# Patient Record
Sex: Female | Born: 1970 | Race: White | Hispanic: No | Marital: Married | Smoking: Never smoker
Health system: Southern US, Community
[De-identification: ages and names within clinical notes are randomized; demographics above are authoritative.]

## PROBLEM LIST (undated history)

## (undated) DIAGNOSIS — M1990 Unspecified osteoarthritis, unspecified site: Secondary | ICD-10-CM

## (undated) DIAGNOSIS — G43909 Migraine, unspecified, not intractable, without status migrainosus: Secondary | ICD-10-CM

## (undated) DIAGNOSIS — I10 Essential (primary) hypertension: Secondary | ICD-10-CM

## (undated) HISTORY — DX: Migraine, unspecified, not intractable, without status migrainosus: G43.909

---

## 2005-10-25 ENCOUNTER — Observation Stay: Payer: Self-pay

## 2005-11-01 ENCOUNTER — Ambulatory Visit: Payer: Self-pay | Admitting: Unknown Physician Specialty

## 2005-11-26 ENCOUNTER — Observation Stay: Payer: Self-pay | Admitting: Obstetrics & Gynecology

## 2005-11-29 ENCOUNTER — Inpatient Hospital Stay: Payer: Self-pay

## 2014-06-06 ENCOUNTER — Ambulatory Visit: Payer: Self-pay

## 2015-08-08 ENCOUNTER — Encounter: Payer: Self-pay | Admitting: Physician Assistant

## 2015-08-08 ENCOUNTER — Ambulatory Visit: Payer: Self-pay | Admitting: Physician Assistant

## 2015-08-08 VITALS — BP 140/99 | HR 80 | Temp 97.7°F

## 2015-08-08 DIAGNOSIS — E559 Vitamin D deficiency, unspecified: Secondary | ICD-10-CM

## 2015-08-08 DIAGNOSIS — M25569 Pain in unspecified knee: Secondary | ICD-10-CM

## 2015-08-08 DIAGNOSIS — Z0000 Encounter for general adult medical examination without abnormal findings: Secondary | ICD-10-CM

## 2015-08-08 DIAGNOSIS — G8929 Other chronic pain: Secondary | ICD-10-CM

## 2015-08-08 DIAGNOSIS — J018 Other acute sinusitis: Secondary | ICD-10-CM

## 2015-08-08 DIAGNOSIS — G4700 Insomnia, unspecified: Secondary | ICD-10-CM

## 2015-08-08 DIAGNOSIS — G43809 Other migraine, not intractable, without status migrainosus: Secondary | ICD-10-CM

## 2015-08-08 MED ORDER — PREDNISONE 10 MG PO TABS: 30.0000 mg | ORAL_TABLET | Freq: Every day | ORAL | Status: DC

## 2015-08-08 MED ORDER — NAPROXEN 500 MG PO TABS: 500.0000 mg | ORAL_TABLET | Freq: Two times a day (BID) | ORAL | Status: DC

## 2015-08-08 MED ORDER — SUMATRIPTAN SUCCINATE 50 MG PO TABS: 50.0000 mg | ORAL_TABLET | ORAL | Status: DC | PRN

## 2015-08-08 MED ORDER — AZITHROMYCIN 250 MG PO TABS: ORAL_TABLET | ORAL | Status: DC

## 2015-08-08 MED ORDER — ZOLPIDEM TARTRATE 10 MG PO TABS: 10.0000 mg | ORAL_TABLET | Freq: Every evening | ORAL | Status: DC | PRN

## 2015-08-08 MED ORDER — TRAMADOL HCL 50 MG PO TABS: 50.0000 mg | ORAL_TABLET | Freq: Three times a day (TID) | ORAL | Status: DC | PRN

## 2015-08-08 MED ORDER — RIZATRIPTAN BENZOATE 10 MG PO TABS: 10.0000 mg | ORAL_TABLET | ORAL | Status: DC | PRN

## 2015-08-08 MED ORDER — FLUTICASONE PROPIONATE 50 MCG/ACT NA SUSP: 2.0000 | Freq: Every day | NASAL | Status: DC

## 2015-08-08 NOTE — Progress Notes (Signed)
S: pt needs med refills, also has sinus pain and headache, pressure in ears, no fever/chills, no cp/sob, no congestion, some nausea from pnd, hx migraines, chronic knee pain , and insomnia  O: vitals w elevated bp, will recheck prior to discharge, tms dull and pink on left, nasal mucosa grossly red and swollen on left, throat wnl, neck supple no lymph, lungs c t a, cv rrr  A: elevated bp without hx of htn, acute sinusitis, migraine headache, chronic knee pain, insomnia  P: recheck bp, zpack, flonase, prednisone 30mg  qd x 3d, refill on imitrex 50mg , maxalt 10mg , naproxen 500mg , ambien 10mg  , tramadol

## 2015-08-09 LAB — CMP12+LP+TP+TSH+6AC+CBC/D/PLT
A/G RATIO: 1.9 (ref 1.1–2.5)
ALT: 16 IU/L (ref 0–32)
AST: 17 IU/L (ref 0–40)
Albumin: 4.3 g/dL (ref 3.5–5.5)
Alkaline Phosphatase: 90 IU/L (ref 39–117)
BASOS ABS: 0 10*3/uL (ref 0.0–0.2)
BILIRUBIN TOTAL: 0.5 mg/dL (ref 0.0–1.2)
BUN / CREAT RATIO: 13 (ref 9–23)
BUN: 9 mg/dL (ref 6–24)
Basos: 0 %
CHOLESTEROL TOTAL: 231 mg/dL — AB (ref 100–199)
Calcium: 8.9 mg/dL (ref 8.7–10.2)
Chloride: 100 mmol/L (ref 97–106)
Chol/HDL Ratio: 6.1 ratio units — ABNORMAL HIGH (ref 0.0–4.4)
Creatinine, Ser: 0.68 mg/dL (ref 0.57–1.00)
EOS (ABSOLUTE): 0.1 10*3/uL (ref 0.0–0.4)
EOS: 2 %
ESTIMATED CHD RISK: 1.7 times avg. — AB (ref 0.0–1.0)
FREE THYROXINE INDEX: 2.5 (ref 1.2–4.9)
GFR calc Af Amer: 123 mL/min/{1.73_m2} (ref >59)
GFR calc non Af Amer: 107 mL/min/{1.73_m2} (ref >59)
GGT: 38 IU/L (ref 0–60)
Globulin, Total: 2.3 g/dL (ref 1.5–4.5)
Glucose: 90 mg/dL (ref 65–99)
HDL: 38 mg/dL — AB (ref >39)
HEMOGLOBIN: 13.7 g/dL (ref 11.1–15.9)
Hematocrit: 40.9 % (ref 34.0–46.6)
IMMATURE GRANULOCYTES: 0 %
IRON: 119 ug/dL (ref 27–159)
Immature Grans (Abs): 0 10*3/uL (ref 0.0–0.1)
LDH: 180 IU/L (ref 119–226)
LDL Calculated: 172 mg/dL — ABNORMAL HIGH (ref 0–99)
LYMPHS ABS: 1.8 10*3/uL (ref 0.7–3.1)
Lymphs: 28 %
MCH: 29.4 pg (ref 26.6–33.0)
MCHC: 33.5 g/dL (ref 31.5–35.7)
MCV: 88 fL (ref 79–97)
Monocytes Absolute: 0.5 10*3/uL (ref 0.1–0.9)
Monocytes: 8 %
NEUTROS PCT: 62 %
Neutrophils Absolute: 3.9 10*3/uL (ref 1.4–7.0)
PLATELETS: 248 10*3/uL (ref 150–379)
Phosphorus: 2.8 mg/dL (ref 2.5–4.5)
Potassium: 4.8 mmol/L (ref 3.5–5.2)
RBC: 4.66 x10E6/uL (ref 3.77–5.28)
RDW: 12.7 % (ref 12.3–15.4)
Sodium: 137 mmol/L (ref 136–144)
T3 UPTAKE RATIO: 24 % (ref 24–39)
T4, Total: 10.4 ug/dL (ref 4.5–12.0)
TOTAL PROTEIN: 6.6 g/dL (ref 6.0–8.5)
TSH: 0.844 u[IU]/mL (ref 0.450–4.500)
Triglycerides: 106 mg/dL (ref 0–149)
Uric Acid: 4.5 mg/dL (ref 2.5–7.1)
VLDL CHOLESTEROL CAL: 21 mg/dL (ref 5–40)
WBC: 6.3 10*3/uL (ref 3.4–10.8)

## 2015-08-09 LAB — VITAMIN D 25 HYDROXY (VIT D DEFICIENCY, FRACTURES): Vit D, 25-Hydroxy: 15.1 ng/mL — ABNORMAL LOW (ref 30.0–100.0)

## 2015-08-12 MED ORDER — VITAMIN D (ERGOCALCIFEROL) 1.25 MG (50000 UNIT) PO CAPS: 50000.0000 [IU] | ORAL_CAPSULE | ORAL | Status: DC

## 2015-08-12 NOTE — Progress Notes (Signed)
Vit d was low, sent in rx for vit d to [facility], cma to call pt and notify of results of labs, recheck in 3 months for fasting lipids and vit d level

## 2015-08-12 NOTE — Addendum Note (Signed)
Addended by: Faythe GheeFISHER, Shakeia Krus W on: 08/12/2015 08:31 AM   Modules accepted: Orders

## 2015-09-26 ENCOUNTER — Encounter: Payer: Self-pay | Admitting: Physician Assistant

## 2015-09-26 ENCOUNTER — Ambulatory Visit: Payer: Self-pay | Admitting: Physician Assistant

## 2015-09-26 VITALS — BP 160/90 | HR 92 | Temp 98.2°F

## 2015-09-26 DIAGNOSIS — J0120 Acute ethmoidal sinusitis, unspecified: Secondary | ICD-10-CM

## 2015-09-26 MED ORDER — LEVOFLOXACIN 500 MG PO TABS
500.0000 mg | ORAL_TABLET | Freq: Every day | ORAL | Status: DC
Start: 2015-09-26 — End: 2015-12-09

## 2015-09-26 MED ORDER — METHYLPREDNISOLONE 4 MG PO TBPK: ORAL_TABLET | ORAL | Status: DC

## 2015-09-26 MED ORDER — PSEUDOEPH-BROMPHEN-DM 30-2-10 MG/5ML PO SYRP: 5.0000 mL | ORAL_SOLUTION | Freq: Four times a day (QID) | ORAL | Status: DC | PRN

## 2015-09-26 NOTE — Progress Notes (Signed)
S.  Patient c/o one week of sinus congestion, ear pressure, sore throat, non-productive cough and chest congestion.  Denies Fever/chill or  N/V/D. No palliative measures for this compliant. O.  No acute distress, HEENT for bilateral maxillary guarding, edematous bilateral nasal turbinates, thick post nasal drainage.  Neck supple, Lungs CTA, and Heart RRR> A. Maxillary sinusitis. P.  Levaquin, Bromfed DM, and Medrol dose pack.  F/U PRN.

## 2015-12-09 ENCOUNTER — Ambulatory Visit: Payer: Self-pay | Admitting: Registered Nurse

## 2015-12-09 VITALS — BP 180/99 | HR 103 | Temp 98.4°F

## 2015-12-09 DIAGNOSIS — J301 Allergic rhinitis due to pollen: Secondary | ICD-10-CM

## 2015-12-09 DIAGNOSIS — G8929 Other chronic pain: Secondary | ICD-10-CM

## 2015-12-09 DIAGNOSIS — H6593 Unspecified nonsuppurative otitis media, bilateral: Secondary | ICD-10-CM

## 2015-12-09 DIAGNOSIS — M25561 Pain in right knee: Secondary | ICD-10-CM

## 2015-12-09 DIAGNOSIS — J0121 Acute recurrent ethmoidal sinusitis: Secondary | ICD-10-CM

## 2015-12-09 DIAGNOSIS — R509 Fever, unspecified: Secondary | ICD-10-CM

## 2015-12-09 LAB — POCT INFLUENZA A/B
INFLUENZA A, POC: NEGATIVE
Influenza B, POC: NEGATIVE

## 2015-12-09 MED ORDER — CETIRIZINE HCL 10 MG PO TABS: 10.0000 mg | ORAL_TABLET | Freq: Every day | ORAL | Status: DC

## 2015-12-09 MED ORDER — DOXYCYCLINE HYCLATE 100 MG PO TABS: 100.0000 mg | ORAL_TABLET | Freq: Two times a day (BID) | ORAL | Status: DC

## 2015-12-09 MED ORDER — BENZONATATE 200 MG PO CAPS: 200.0000 mg | ORAL_CAPSULE | Freq: Three times a day (TID) | ORAL | Status: DC | PRN

## 2015-12-09 MED ORDER — FLUTICASONE PROPIONATE 50 MCG/ACT NA SUSP: 2.0000 | Freq: Every day | NASAL | Status: DC

## 2015-12-09 MED ORDER — TRAMADOL HCL 50 MG PO TABS: 50.0000 mg | ORAL_TABLET | Freq: Two times a day (BID) | ORAL | Status: AC | PRN

## 2015-12-09 MED ORDER — SALINE SPRAY 0.65 % NA SOLN: 1.0000 | NASAL | Status: DC | PRN

## 2015-12-09 NOTE — Progress Notes (Signed)
Subjective:    Patient ID: Alexandria [name], female    DOB: Feb 03, 1971, 45 y.o.   MRN: [id number]  HPI Comments: Caucasian female here for evaluation of right knee pain, chest congestion, cough, sinus pressure.  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  Within past three months had prednisone, Bromphed, levofloxacin for similar symptoms resolved.  Needs refill on tramadol  po prn also for chronic right knee pain not taking daily only if bad had ski injury not surgical candidate per patient.     Review of Systems  Constitutional: Negative for fever, chills, diaphoresis, activity change, appetite change, fatigue and unexpected weight change.  HENT: Positive for congestion, postnasal drip, rhinorrhea and sinus pressure. Negative for dental problem, drooling, ear discharge, ear pain, facial swelling, hearing loss, mouth sores, nosebleeds, sneezing, sore throat, tinnitus, trouble swallowing and voice change.   Eyes: Negative for photophobia, pain, discharge, redness, itching and visual disturbance.  Respiratory: Positive for cough. Negative for choking, chest tightness, shortness of breath, wheezing and stridor.   Cardiovascular: Negative for chest pain, palpitations and leg swelling.  Gastrointestinal: Negative for nausea, vomiting, abdominal pain, diarrhea, constipation, blood in stool and abdominal distention.  Endocrine: Negative for cold intolerance and heat intolerance.  Genitourinary: Negative for dysuria, hematuria and difficulty urinating.  Musculoskeletal: Positive for arthralgias. Negative for myalgias, back pain, joint swelling, gait problem, neck pain and neck stiffness.  Skin: Negative for color change, pallor, rash and wound.  Allergic/Immunologic: Positive for environmental allergies. Negative for food allergies.  Neurological: Negative for dizziness, tremors, seizures, syncope, facial asymmetry, speech difficulty, weakness, light-headedness, numbness and headaches.  Hematological:  Negative for adenopathy. Does not bruise/bleed easily.  Psychiatric/Behavioral: Negative for behavioral problems, confusion, sleep disturbance and agitation.       Objective:   Physical Exam  Constitutional: She is oriented to person, place, and time. She appears well-developed and well-nourished. She is active and cooperative.  Non-toxic appearance. She does not have a sickly appearance. She appears ill. No distress.  HENT:  Head: Normocephalic and atraumatic.  Right Ear: Hearing, external ear and ear canal normal. A middle ear effusion is present.  Left Ear: Hearing, external ear and ear canal normal. A middle ear effusion is present.  Nose: Mucosal edema and rhinorrhea present. No nose lacerations, sinus tenderness, nasal deformity, septal deviation or nasal septal hematoma. No epistaxis.  No foreign bodies. Right sinus exhibits no maxillary sinus tenderness and no frontal sinus tenderness. Left sinus exhibits no maxillary sinus tenderness and no frontal sinus tenderness.  Mouth/Throat: Uvula is midline and mucous membranes are normal. Mucous membranes are not pale, not dry and not cyanotic. She does not have dentures. No oral lesions. No trismus in the jaw. Normal dentition. No dental abscesses, uvula swelling, lacerations or dental caries. Posterior oropharyngeal edema and posterior oropharyngeal erythema present. No oropharyngeal exudate or tonsillar abscesses.  Cobblestoning posterior pharynx; bilateral nasal turbinates with edema/erythema/yellow discharge; bilateral TMs with air fluid level clear   Eyes: Conjunctivae, EOM and lids are normal. Pupils are equal, round, and reactive to light. Right eye exhibits no chemosis, no discharge, no exudate and no hordeolum. No foreign body present in the right eye. Left eye exhibits no chemosis, no discharge, no exudate and no hordeolum. No foreign body present in the left eye. Right conjunctiva is not injected. Right conjunctiva has no hemorrhage. Left  conjunctiva is not injected. Left conjunctiva has no hemorrhage. No scleral icterus. Right eye exhibits normal extraocular motion and no nystagmus. Left eye exhibits normal extraocular  motion and no nystagmus. Right pupil is round and reactive. Left pupil is round and reactive. Pupils are equal.  Neck: Trachea normal and normal range of motion. Neck supple. No tracheal tenderness, no spinous process tenderness and no muscular tenderness present. No rigidity. No tracheal deviation, no edema, no erythema and normal range of motion present. No thyroid mass and no thyromegaly present.  Cardiovascular: Normal rate, regular rhythm, S1 normal, S2 normal, normal heart sounds and intact distal pulses.  PMI is not displaced.  Exam reveals no gallop and no friction rub.   No murmur heard. Pulmonary/Chest: Effort normal and breath sounds normal. No accessory muscle usage or stridor. No respiratory distress. She has no decreased breath sounds. She has no wheezes. She has no rhonchi. She has no rales. She exhibits no tenderness.  Nonproductive cough intermittent in exam room with deep inspiration/talking  Abdominal: Soft. She exhibits no distension.  Musculoskeletal: Normal range of motion. She exhibits no edema or tenderness.       Right shoulder: Normal.       Left shoulder: Normal.       Right hip: Normal.       Left hip: Normal.       Right knee: Normal.       Left knee: Normal.       Cervical back: Normal.       Right hand: Normal.       Left hand: Normal.  Pain with flexion and weight bearing no limp noted or effusion/bruising/erythema/rash  Lymphadenopathy:       Head (right side): No submental, no submandibular, no tonsillar, no preauricular, no posterior auricular and no occipital adenopathy present.       Head (left side): No submental, no submandibular, no tonsillar, no preauricular, no posterior auricular and no occipital adenopathy present.    She has no cervical adenopathy.       Right  cervical: No superficial cervical, no deep cervical and no posterior cervical adenopathy present.      Left cervical: No superficial cervical, no deep cervical and no posterior cervical adenopathy present.  Neurological: She is alert and oriented to person, place, and time. She has normal strength. She is not disoriented. She displays no atrophy and no tremor. No cranial nerve deficit or sensory deficit. She exhibits normal muscle tone. She displays no seizure activity. Coordination and gait normal. GCS eye subscore is 4. GCS verbal subscore is 5. GCS motor subscore is 6.  Skin: Skin is warm, dry and intact. No abrasion, no bruising, no burn, no ecchymosis, no laceration, no lesion, no petechiae and no rash noted. She is not diaphoretic. No cyanosis or erythema. No pallor. Nails show no clubbing.  Psychiatric: She has a normal mood and affect. Her speech is normal and behavior is normal. Judgment and thought content normal. Cognition and memory are normal.  Nursing note and vitals reviewed.     Reviewed [place] Controlled Substances website with patient discussed weight loss/heat/ice/NSAID use did not recommend daily use of medication: 11/19/2015 08/08/2015 TRAMADOL HCL 50 MG TABLET [id number] [address] [place], [place] [id number] [address] [place], [place] Desir, DEB Mar 14, 1971 [address] [place], [place] [address] 01 20 11/19/2015 08/08/2015 ZOLPIDEM TARTRATE 10 MG TABLET [id number] [address] [place], [place] [id number] [address] [place], [place] Dai, DEB Sep 10, 1971 [address] [place], [place] [address] 04 0 10/14/2015 08/08/2015 TRAMADOL HCL 50 MG TABLET [id number]  20 5 2 3  [id number] [address] [place], [place] [id number] [address] [place], [place] Dumlao, DEB Jan 06, 1971 [address] [place], [place] [address] 04 20 10/14/2015 08/08/2015 ZOLPIDEM TARTRATE 10 MG  TABLET [id number] 30 30 2 3  [id number] [address] [place], [place] [id number] [address] [place], [place] Cuffe, DEB 1970-11-05 [address] [place], [place] [address] 04 0 09/11/2015 08/08/2015 TRAMADOL HCL 50 MG TABLET [id number] 20 5 1 3  [id number] [address] [place], [place] [id number] [address] [place], [place] Galyean, DEB [birth year] [address] [place], [place] [address] 04 20 09/11/2015 08/08/2015 ZOLPIDEM TARTRATE 10 MG TABLET [id number] 30 30 1 3  [id number] [address] [place], [place] [id number] [address] [place], [place] Nodal, DEB [birth year] [address] [place], [place] [address] 04 0 08/08/2015 08/08/2015 TRAMADOL HCL 50 MG TABLET [id number] 20 5 0 3 [id number] [address] [place], [place] [id number] [address] [place], [place] Allcock, DEB January 23, 1971 [address] [place], [place] [address] 04 20 08/08/2015 08/08/2015 ZOLPIDEM TARTRATE 10 MG TABLET [id number] 30 30 0 3 [id number] [address] [place], [place] [id number] [address] [place], [place], DEB [birth year] [address] [place], [place] [address] 04 0 01/01/2015 09/04/2014 ZOLPIDEM TARTRATE 10 MG TABLET [id number] 30 30 3 3  [id number] [address] [place], [place] [id number] [address] [place], [place] Soyars, DEB 05/31/1971 [address] [place], [place] [address] 04 0 12/05/2014 09/04/2014 ZOLPIDEM TARTRATE 10 MG TABLET [id number] 30 30 2 3  [id number] [address] [place], [place] [id number] [address] [place], [place] Orner, DEB [birth year] [address] [place], [place] [address] 04 0 11/19/2014 11/19/2014 TRAMADOL HCL 50 MG TABLET [id number] 20 5 0 0 [id number] [address] [place], [place] [id number] [address] [place], [place] Reetz, DEB August 23, 1971 [address] [place], [place] [address] 04 20  11/04/2014 09/04/2014 TRAMADOL HCL 50 MG TABLET [id number] 20 5 3 3  [id number] [address] [place], [place] [id number] [address] [place], [place], DEB [birth year] [address] [place], [place] [address]     Assessment & Plan:  A-acute ethmoidal sinusitis, allergic rhinitis seasonal, otitis media with effusion bilaterally, right knee pain chronic P-Supportive treatment.   No evidence of invasive bacterial infection, non toxic and well hydrated.  This is most likely self limiting viral infection.  I do not see where any further testing or imaging is necessary at this time.   I will suggest supportive care, rest, good hygiene and encourage the patient to take adequate fluids.  The patient is to return to clinic or EMERGENCY ROOM if symptoms worsen or change significantly e.g. ear pain, fever, purulent discharge from ears or bleeding.  Patient verbalized agreement and understanding of treatment plan.    Restart zyrtec 10mg  po daily refilled.  Patient may use normal saline nasal spray as needed.  Consider antihistamine or nasal steroid use.  Avoid triggers if possible.  Shower prior to bedtime if exposed to triggers.  If allergic dust/dust mites recommend mattress/pillow covers/encasements; washing linens, vacuuming, sweeping, dusting weekly.  Call or return to clinic as needed if these symptoms worsen or fail to improve as anticipated.    Patient verbalized understanding of instructions, agreed with plan of care and had no further questions at this time.  P2:  Avoidance and hand washing.  Restart flonase 1 spray each nostril BID, saline 2 sprays each nostril q2h prn congestion and zyrtec 10mg  po daily.  If no improvement with 48 hours of saline and flonase use start doxycycline 100mg  po BID x 10 days.  Rx given.  No evidence of systemic bacterial infection, non toxic and well hydrated.  I do not see where any further testing or imaging is necessary at this time.   I will  suggest supportive care, rest, good hygiene and encourage the patient to take adequate fluids.  The patient is to return to clinic or EMERGENCY ROOM if symptoms worsen or change significantly. Patient verbalized agreement and understanding of treatment plan and had no further questions at this time.   P2:  Hand washing and cover cough  Refilled patient tramadol 50mg  po BID prn #20 RF0 for severe pain only.  Avoid driving and alcohol intake for 8 hours after taking medication may cause sedation.  Discussed cryotherapy, stretching, weight loss and tylenol 1000mg  po QID prn pain first.  Patient was instructed to rest, ice and elevate leg.   Wear supportive shoes.   Medications as directed.  Call or return to clinic as needed if these symptoms worsen or fail to improve as anticipated.  Patient verbalized agreement and understanding of treatment plan.   P2:  ROM exercises, Stretching, weight loss

## 2016-01-28 ENCOUNTER — Other Ambulatory Visit: Payer: Self-pay | Admitting: Physician Assistant

## 2016-01-28 ENCOUNTER — Other Ambulatory Visit: Payer: Self-pay | Admitting: Family Medicine

## 2016-02-12 ENCOUNTER — Other Ambulatory Visit: Payer: Self-pay | Admitting: Physician Assistant

## 2016-02-13 NOTE — Telephone Encounter (Signed)
Med refill approved 

## 2016-03-10 ENCOUNTER — Encounter: Payer: Self-pay | Admitting: Physician Assistant

## 2016-03-10 ENCOUNTER — Ambulatory Visit: Payer: Self-pay

## 2016-03-10 VITALS — BP 150/80 | HR 99 | Temp 98.6°F

## 2016-03-10 DIAGNOSIS — N390 Urinary tract infection, site not specified: Secondary | ICD-10-CM

## 2016-03-10 DIAGNOSIS — R319 Hematuria, unspecified: Secondary | ICD-10-CM

## 2016-03-10 DIAGNOSIS — R509 Fever, unspecified: Secondary | ICD-10-CM

## 2016-03-10 DIAGNOSIS — G43809 Other migraine, not intractable, without status migrainosus: Secondary | ICD-10-CM

## 2016-03-10 LAB — POCT URINALYSIS DIPSTICK
GLUCOSE UA: NEGATIVE
KETONES UA: NEGATIVE
Nitrite, UA: NEGATIVE
SPEC GRAV UA: 1.02
Urobilinogen, UA: 1
pH, UA: 5.5

## 2016-03-10 MED ORDER — CIPROFLOXACIN HCL 250 MG PO TABS: 250.0000 mg | ORAL_TABLET | Freq: Two times a day (BID) | ORAL | Status: DC

## 2016-03-10 MED ORDER — TRAMADOL HCL 50 MG PO TABS: 50.0000 mg | ORAL_TABLET | Freq: Three times a day (TID) | ORAL | Status: DC | PRN

## 2016-03-10 MED ORDER — NAPROXEN 500 MG PO TABS: 500.0000 mg | ORAL_TABLET | Freq: Two times a day (BID) | ORAL | Status: DC

## 2016-03-10 NOTE — Progress Notes (Signed)
S: C/o runny nose and congestion with dry cough for 2 days, + fever, chills, temp at 102.9 last night; denies cp/sob, v/d; states is urinating a lot, denies tick bite; also needs refill on tramadol for knee pain, only uses at night, naprosyn for migraines;   O: PE: vitals wnl, nad,  perrl eomi, normocephalic, tms dull, nasal mucosa red and swollen, throat injected, neck supple no lymph, lungs c t a, cv rrr, neuro intact, ua 1+ leuks, 1+ blood, 1+ bili  A:  Acute uti   P: drink fluids, continue regular meds , use otc meds of choice, return if not improving in 5 days, return earlier if worsening , tramadol 50mg  #30 nr, naprosyn, cipro 250mg  bid x 7d

## 2016-04-14 ENCOUNTER — Other Ambulatory Visit: Payer: Self-pay | Admitting: Physician Assistant

## 2016-04-15 NOTE — Telephone Encounter (Signed)
Med refill approved 

## 2016-05-25 ENCOUNTER — Encounter (INDEPENDENT_AMBULATORY_CARE_PROVIDER_SITE_OTHER): Payer: Self-pay

## 2016-05-25 ENCOUNTER — Encounter: Payer: Self-pay | Admitting: Family Medicine

## 2016-05-25 ENCOUNTER — Ambulatory Visit (INDEPENDENT_AMBULATORY_CARE_PROVIDER_SITE_OTHER): Payer: 59 | Admitting: Family Medicine

## 2016-05-25 VITALS — BP 154/92 | HR 86 | Temp 98.1°F | Ht 63.5 in | Wt 194.0 lb

## 2016-05-25 DIAGNOSIS — G43909 Migraine, unspecified, not intractable, without status migrainosus: Secondary | ICD-10-CM | POA: Insufficient documentation

## 2016-05-25 DIAGNOSIS — M1711 Unilateral primary osteoarthritis, right knee: Secondary | ICD-10-CM | POA: Insufficient documentation

## 2016-05-25 DIAGNOSIS — Z3041 Encounter for surveillance of contraceptive pills: Secondary | ICD-10-CM | POA: Diagnosis not present

## 2016-05-25 DIAGNOSIS — I10 Essential (primary) hypertension: Secondary | ICD-10-CM

## 2016-05-25 DIAGNOSIS — M1731 Unilateral post-traumatic osteoarthritis, right knee: Secondary | ICD-10-CM

## 2016-05-25 DIAGNOSIS — J309 Allergic rhinitis, unspecified: Secondary | ICD-10-CM | POA: Diagnosis not present

## 2016-05-25 DIAGNOSIS — G43109 Migraine with aura, not intractable, without status migrainosus: Secondary | ICD-10-CM | POA: Diagnosis not present

## 2016-05-25 MED ORDER — LEVONORGEST-ETH ESTRAD 91-DAY 0.15-0.03 &0.01 MG PO TABS: 1.0000 | ORAL_TABLET | Freq: Every day | ORAL | 4 refills | Status: DC

## 2016-05-25 MED ORDER — FLUTICASONE PROPIONATE 50 MCG/ACT NA SUSP: 2.0000 | Freq: Every day | NASAL | 6 refills | Status: DC

## 2016-05-25 MED ORDER — FEXOFENADINE HCL 180 MG PO TABS: 180.0000 mg | ORAL_TABLET | Freq: Every day | ORAL | Status: DC

## 2016-05-25 MED ORDER — AMLODIPINE BESYLATE 2.5 MG PO TABS: 2.5000 mg | ORAL_TABLET | Freq: Every day | ORAL | 3 refills | Status: DC

## 2016-05-25 MED ORDER — FEXOFENADINE HCL 180 MG PO TABS: 180.0000 mg | ORAL_TABLET | Freq: Every day | ORAL | 1 refills | Status: DC

## 2016-05-25 NOTE — Assessment & Plan Note (Addendum)
Notes history of migraines with occasional aura. No current migraine. She will continue current medications as needed for this. Return precautions in AVS.

## 2016-05-25 NOTE — Assessment & Plan Note (Signed)
Patient currently on a combined OCP. Discussed risk of this medication in somebody with migraines with aura. She notes she wants to continue this medication and accepts the risk. I did advise that there are other better alternatives though she declined these at this time.

## 2016-05-25 NOTE — Progress Notes (Signed)
Pre visit review using our clinic review tool, if applicable. No additional management support is needed unless otherwise documented below in the visit note. 

## 2016-05-25 NOTE — Assessment & Plan Note (Signed)
Upper respiratory symptoms most consistent with allergic rhinitis. We will treat with Flonase and Allegra.

## 2016-05-25 NOTE — Progress Notes (Signed)
Alexandria [name] Tobby [name], Alexandria [name] Phone: [phone]  Alexandria SierrasDebbie J [name] is a 45 y.o. female who presents today for new patient visit.  Elevated blood pressure: No history of elevated blood pressure. No chest pain, shortness of breath, edema, or palpitations. No history of medications for this. Parents have hypertension.  Migraines: Patient has a history of migraines. Typically gets them about once a month. This is improved over the last several years. Started when she was [age]. Notes she has to go in a dark room and lay down. Positive photophobia and phonophobia. Takes sumatriptan or rizatriptan. Has only had to repeat dosing of one of these once. Notes occasionally has aura with these.  Allergic rhinitis: Notes some sinus pressure and congestion that started yesterday. Ears have mild pressure. Nothing out of her nose. Does have a history of allergies. Some postnasal drip. Zyrtec hasn't been terribly helpful.  Right knee osteoarthritis: Patient notes followed by orthopedics for this. Tramadol and Aleve are beneficial. Notes she hurt it after snow skiing 4 years ago and has had some discomfort since then.  Oral contraceptive therapy: Patient needs a refill on her birth control. No history of blood clots. Does not smoke. On her current form of birth control she has a period every 3 months. Last for about 4 days.  Active Ambulatory Problems    Diagnosis Date Noted  . Essential hypertension 05/25/2016  . Migraine 05/25/2016  . Allergic rhinitis 05/25/2016  . Oral contraceptive use 05/25/2016  . Osteoarthritis of right knee 05/25/2016   Resolved Ambulatory Problems    Diagnosis Date Noted  . No Resolved Ambulatory Problems   Past Medical History:  Diagnosis Date  . Chicken pox   . Migraines     Family History  Problem Relation Age of Onset  . Hypertension Mother   . Hypertension Father     Social History   Social History  . Marital status: Married    Spouse name: N/A  . Number of children:  N/A  . Years of education: N/A   Occupational History  . Not on file.   Social History Main Topics  . Smoking status: Never Smoker  . Smokeless tobacco: Never Used  . Alcohol use 0.0 - 0.6 oz/week  . Drug use: No  . Sexual activity: Not on file   Other Topics Concern  . Not on file   Social History Narrative  . No narrative on file    ROS  General:  Negative for nexplained weight loss, fever Skin: Negative for new or changing mole, sore that won't heal HEENT: Negative for trouble hearing, trouble seeing, ringing in ears, mouth sores, hoarseness, change in voice, dysphagia. CV:  Negative for chest pain, dyspnea, edema, palpitations Resp: Negative for cough, dyspnea, hemoptysis GI: Negative for nausea, vomiting, diarrhea, constipation, abdominal pain, melena, hematochezia. GU: Negative for dysuria, incontinence, urinary hesitance, hematuria, vaginal or penile discharge, polyuria, sexual difficulty, lumps in testicle or breasts MSK: Negative for muscle cramps or aches, joint pain or swelling Neuro: Positive for headaches, negative for weakness, numbness, dizziness, passing out/fainting Psych: Negative for depression, anxiety, memory problems  Objective  Physical Exam Vitals:   05/25/16 1102  BP: (!) 154/92  Pulse: 86  Temp: 98.1 F (36.7 C)    BP Readings from Last 3 Encounters:  05/25/16 (!) 154/92  03/10/16 (!) 150/80  12/09/15 (!) 180/99   Wt Readings from Last 3 Encounters:  05/25/16 194 lb (88 kg)    Physical Exam  Constitutional: No distress.  HENT:  Head: Normocephalic and atraumatic.  Right Ear: External ear normal.  Left Ear: External ear normal.  Mouth/Throat: No oropharyngeal exudate.  Eyes: Conjunctivae are normal. Pupils are equal, round, and reactive to light.  Cardiovascular: Normal rate, regular rhythm and normal heart sounds.   Pulmonary/Chest: Breath sounds normal.  Abdominal: Soft. Bowel sounds are normal. She exhibits no distension.  There is no tenderness. There is no rebound and no guarding.  Musculoskeletal: She exhibits no edema.  Bilateral knees with no tenderness, swelling, warmth, or erythema, negative McMurray's, no ligamentous laxity  Neurological: She is alert. Gait normal.  Moves all extremities equally  Skin: Skin is warm and dry. She is not diaphoretic.  Psychiatric: Affect normal.     Assessment/Plan:   Essential hypertension Elevated today. Has been elevated recently. Does have a family history. We'll start on low dose amlodipine. We'll check lab work as well. She is given a handwritten prescription [facility] to have her lab work done. She'll follow-up in one week for nurse visit and she will check her blood pressure daily until she follows up.  Migraine Notes history of migraines with occasional aura. No current migraine. She will continue current medications as needed for this. Return precautions in AVS.  Allergic rhinitis Upper respiratory symptoms most consistent with allergic rhinitis. We will treat with Flonase and Allegra.  Oral contraceptive use Patient currently on a combined OCP. Discussed risk of this medication in somebody with migraines with aura. She notes she wants to continue this medication and accepts the risk. I did advise that there are other better alternatives though she declined these at this time.  Osteoarthritis of right knee Followed by orthopedics. Continue naproxen and tramadol.   No orders of the defined types were placed in this encounter.    Alexandria [name] Alexandria [name], Alexandria [name] [facility]

## 2016-05-25 NOTE — Patient Instructions (Addendum)
Nice to meet you. You on Flonase and Allegra for your allergies. Please continue to monitor your headaches. If they worsen or change please let us know. We'll start you on amlodipine for your blood pressure. Please check your blood pressure at home for the next several weeks and give us a call with the results. We will refill your birth control. Given her history of migraines there is risk of stroke with this. Please be aware of this. If you develop persistent headaches, numbness, weakness, or any new or change in symptoms please seek medical attention.

## 2016-05-25 NOTE — Assessment & Plan Note (Signed)
Elevated today. Has been elevated recently. Does have a family history. We'll start on low dose amlodipine. We'll check lab work as well. She is given a handwritten prescription go to the employee health clinic to have her lab work done. She'll follow-up in one week for nurse visit and she will check her blood pressure daily until she follows up.

## 2016-05-25 NOTE — Assessment & Plan Note (Signed)
Followed by orthopedics. Continue naproxen and tramadol.

## 2016-06-02 ENCOUNTER — Ambulatory Visit (INDEPENDENT_AMBULATORY_CARE_PROVIDER_SITE_OTHER): Payer: 59

## 2016-06-02 VITALS — BP 140/82 | HR 86 | Resp 18

## 2016-06-02 DIAGNOSIS — I10 Essential (primary) hypertension: Secondary | ICD-10-CM | POA: Diagnosis not present

## 2016-06-02 NOTE — Progress Notes (Signed)
Care was provided under my supervision. BP stable. Continue current medication.  Everlene OtherJayce Kainon Varady DO

## 2016-06-02 NOTE — Progress Notes (Signed)
Patient came in for BP check.  Started amlodopine last week. Taking 2.5mg  daily, no issues.  PCP requested a BP check in 8/15 OV.   Checked BP in both upper extremities.  See vitals for details.    [facility]   Please advise in PCP's absence, thanks

## 2016-06-28 ENCOUNTER — Ambulatory Visit: Payer: Self-pay | Admitting: Physician Assistant

## 2016-06-28 ENCOUNTER — Encounter: Payer: Self-pay | Admitting: Physician Assistant

## 2016-06-28 VITALS — BP 150/90 | HR 87 | Temp 98.5°F

## 2016-06-28 DIAGNOSIS — I10 Essential (primary) hypertension: Secondary | ICD-10-CM

## 2016-06-28 DIAGNOSIS — B349 Viral infection, unspecified: Secondary | ICD-10-CM

## 2016-06-28 MED ORDER — CETIRIZINE HCL 10 MG PO TABS: 10.0000 mg | ORAL_TABLET | Freq: Every day | ORAL | 11 refills | Status: DC

## 2016-06-28 MED ORDER — AMLODIPINE BESYLATE 5 MG PO TABS: 5.0000 mg | ORAL_TABLET | Freq: Every day | ORAL | 3 refills | Status: DC

## 2016-06-28 MED ORDER — PREDNISONE 10 MG PO TABS: 30.0000 mg | ORAL_TABLET | Freq: Every day | ORAL | 0 refills | Status: DC

## 2016-06-28 NOTE — Progress Notes (Addendum)
S: C/o sinus pressure congestion for 3 days, no fever, chills, cp/sob, v/d; feels "swimmy headed" cough is sporadic, c/o of facial and dental pain. Some nausea ?drainage  Using otc meds:   O: PE: vitals wnl except bp elevated at 150/98, nad,  perrl eomi, normocephalic, lower lip with fever blister,  tms dull, nasal mucosa pin, throat injected, neck supple no lymph, lungs c t a, cv rrr, neuro intact  A:  Acute viral illness , htn  P: drink fluids, continue regular meds , use otc meds of choice, return if not improving in 5 days, return earlier if worsening , prednisone 30mg  qd x 3d, zyrtec instead of allegra as pt states its not helping, increase norvasc to 5mg  qd  Pt called office still having sinus pressure and dizziness , ?if we can call in medication, [facility]

## 2016-07-01 MED ORDER — AZITHROMYCIN 250 MG PO TABS: ORAL_TABLET | ORAL | 0 refills | Status: DC

## 2016-07-01 MED ORDER — MECLIZINE HCL 25 MG PO TABS: 25.0000 mg | ORAL_TABLET | Freq: Three times a day (TID) | ORAL | 0 refills | Status: DC | PRN

## 2016-07-01 NOTE — Addendum Note (Signed)
Addended by: Faythe GheeFISHER, Nakiah Osgood W on: 07/01/2016 09:19 AM   Modules accepted: Orders

## 2016-08-24 ENCOUNTER — Other Ambulatory Visit: Payer: Self-pay | Admitting: Physician Assistant

## 2016-09-16 ENCOUNTER — Ambulatory Visit: Payer: Self-pay | Admitting: Physician Assistant

## 2016-09-16 VITALS — BP 159/90 | HR 93 | Temp 98.0°F

## 2016-09-16 DIAGNOSIS — N390 Urinary tract infection, site not specified: Secondary | ICD-10-CM

## 2016-09-16 DIAGNOSIS — Z299 Encounter for prophylactic measures, unspecified: Secondary | ICD-10-CM

## 2016-09-16 LAB — POCT URINALYSIS DIPSTICK
Bilirubin, UA: NEGATIVE
Glucose, UA: NEGATIVE
Ketones, UA: NEGATIVE
NITRITE UA: NEGATIVE
PROTEIN UA: NEGATIVE
Spec Grav, UA: 1.02
UROBILINOGEN UA: 1
pH, UA: 6.5

## 2016-09-16 MED ORDER — FLUCONAZOLE 150 MG PO TABS: ORAL_TABLET | ORAL | 0 refills | Status: DC

## 2016-09-16 MED ORDER — CIPROFLOXACIN HCL 250 MG PO TABS: 250.0000 mg | ORAL_TABLET | Freq: Two times a day (BID) | ORAL | 0 refills | Status: DC

## 2016-09-16 MED ORDER — TRAMADOL HCL 50 MG PO TABS: 50.0000 mg | ORAL_TABLET | Freq: Three times a day (TID) | ORAL | 3 refills | Status: DC | PRN

## 2016-09-16 MED ORDER — MELOXICAM 15 MG PO TABS: 15.0000 mg | ORAL_TABLET | Freq: Every day | ORAL | 5 refills | Status: DC

## 2016-09-16 NOTE — Addendum Note (Signed)
Addended by: Faythe GheeFISHER, Emaan Gary W on: 09/16/2016 04:09 PM   Modules accepted: Orders

## 2016-09-16 NOTE — Progress Notes (Addendum)
S:  C/o uti sx for 2 days, burning, urgency, frequency, +vaginal discharge, states it looks like yeast, no odor; no abdominal pain or flank pain, no fever/chills:  Remainder ros neg  O:  Vitals wnl, nad, no cva tenderness, back nontender, lungs c t a,cv rrr, abd soft nontender, bs normal, n/v intact; ua 3+ leuks  A: uti  P: cipro 250mg  bid x 7d, increase water intake, add cranberry juice, return if not improving in 2 -3 days, return earlier if worsening, discussed pyelonephritis sx, med refill on tramadol 50mg  30 with 3 refills  Urine culture shows resistant to cipro, [facility]

## 2016-09-20 LAB — URINE CULTURE

## 2016-09-20 MED ORDER — NITROFURANTOIN MONOHYD MACRO 100 MG PO CAPS: 100.0000 mg | ORAL_CAPSULE | Freq: Two times a day (BID) | ORAL | 0 refills | Status: DC

## 2016-09-20 NOTE — Addendum Note (Signed)
Addended by: Faythe GheeFISHER, SUSAN W on: 09/20/2016 08:17 AM   Modules accepted: Orders

## 2016-09-24 ENCOUNTER — Other Ambulatory Visit: Payer: Self-pay | Admitting: Physician Assistant

## 2016-09-24 DIAGNOSIS — E559 Vitamin D deficiency, unspecified: Secondary | ICD-10-CM

## 2016-09-24 NOTE — Telephone Encounter (Signed)
Med refill for vit d approved 

## 2016-11-16 ENCOUNTER — Ambulatory Visit: Payer: Self-pay | Admitting: Physician Assistant

## 2016-11-16 ENCOUNTER — Encounter: Payer: Self-pay | Admitting: Physician Assistant

## 2016-11-16 VITALS — BP 130/70 | HR 89 | Temp 97.9°F

## 2016-11-16 DIAGNOSIS — J069 Acute upper respiratory infection, unspecified: Secondary | ICD-10-CM

## 2016-11-16 DIAGNOSIS — H60543 Acute eczematoid otitis externa, bilateral: Secondary | ICD-10-CM

## 2016-11-16 MED ORDER — FLUCONAZOLE 150 MG PO TABS
ORAL_TABLET | ORAL | 0 refills | Status: DC
Start: 2016-11-16 — End: 2017-03-09

## 2016-11-16 MED ORDER — MOMETASONE FUROATE 0.1 % EX SOLN: Freq: Every day | CUTANEOUS | 6 refills | Status: DC

## 2016-11-16 MED ORDER — AZITHROMYCIN 250 MG PO TABS: ORAL_TABLET | ORAL | 0 refills | Status: DC

## 2016-11-16 NOTE — Progress Notes (Signed)
S: C/o sore throat and congestion for 3 days, no fever, chills, cp/sob, v/d; mucus is green and thick, cough is sporadic, c/o of facial and dental pain. No body aches, also eczema in ears is getting irritated again, ?if we could rx a med to help  Using otc meds:   O: PE: vitals wnl, nad, perrl eomi, normocephalic, ear canals red, a little scaly, tms dull, nasal mucosa red and swollen, throat injected, neck supple no lymph, lungs c t a, cv rrr, neuro intact  A:  Acute sinusitis   P: drink fluids, continue regular meds , use otc meds of choice, return if not improving in 5 days, return earlier if worsening , zpack, diflucan if needed, mometasone .01% lotion

## 2016-11-29 ENCOUNTER — Other Ambulatory Visit: Payer: Self-pay | Admitting: Physician Assistant

## 2016-11-29 DIAGNOSIS — G43809 Other migraine, not intractable, without status migrainosus: Secondary | ICD-10-CM

## 2016-11-29 NOTE — Telephone Encounter (Signed)
Med refill for naproxen approved 

## 2017-03-08 ENCOUNTER — Ambulatory Visit: Payer: Self-pay | Admitting: Physician Assistant

## 2017-03-09 ENCOUNTER — Encounter: Payer: Self-pay | Admitting: Physician Assistant

## 2017-03-09 ENCOUNTER — Ambulatory Visit
Admission: RE | Admit: 2017-03-09 | Discharge: 2017-03-09 | Disposition: A | Discharge status: Home / Self Care | Payer: Managed Care, Other (non HMO) | Source: Ambulatory Visit | Attending: Physician Assistant | Admitting: Physician Assistant

## 2017-03-09 ENCOUNTER — Ambulatory Visit: Payer: Self-pay | Admitting: Physician Assistant

## 2017-03-09 VITALS — BP 140/80 | HR 100 | Temp 98.5°F

## 2017-03-09 DIAGNOSIS — M7989 Other specified soft tissue disorders: Secondary | ICD-10-CM | POA: Insufficient documentation

## 2017-03-09 DIAGNOSIS — M79605 Pain in left leg: Secondary | ICD-10-CM

## 2017-03-09 MED ORDER — FLUOCINOLONE ACETONIDE 0.01 % OT OIL: 2.0000 [drp] | TOPICAL_OIL | Freq: Two times a day (BID) | OTIC | 6 refills | Status: AC

## 2017-03-09 MED ORDER — TRAMADOL HCL 50 MG PO TABS: 50.0000 mg | ORAL_TABLET | Freq: Three times a day (TID) | ORAL | 3 refills | Status: DC | PRN

## 2017-03-09 MED ORDER — DICLOFENAC SODIUM 75 MG PO TBEC: 75.0000 mg | DELAYED_RELEASE_TABLET | Freq: Two times a day (BID) | ORAL | 0 refills | Status: DC

## 2017-03-09 NOTE — Progress Notes (Signed)
S: c/o left leg pain and swelling, pain in left knee and left ankle, lower leg is a little warm, took otc ibuprofen without any relief, no known injury, no fever/chills, fam hx of gout and RA, is on bcp, nonsmoker but is sedentary  O: vitals wnl, nad, skin on lower leg has increased warmth at the anterior and posterior near ankle, ankle is tender in soft tissue area, left knee has small amount swelling, full rom, no bony tenderness, n/v intact  A: left leg pain  P: us of lower leg due to sedentary and bcp use, diclofenac 75mg  bid, refill on dermotic, refill on tramadol 50mg  #20 2 refills, labs for uric acid and RA

## 2017-03-10 LAB — SPECIMEN STATUS

## 2017-03-10 LAB — RHEUMATOID FACTOR: Rhuematoid fact SerPl-aCnc: <10 IU/mL (ref 0.0–13.9)

## 2017-03-10 LAB — URIC ACID: Uric Acid: 6.1 mg/dL (ref 2.5–7.1)

## 2017-03-15 ENCOUNTER — Telehealth: Payer: Self-pay | Admitting: Physician Assistant

## 2017-03-16 ENCOUNTER — Encounter: Payer: Self-pay | Admitting: Physician Assistant

## 2017-03-16 NOTE — Telephone Encounter (Signed)
See message below.  She said you suggested Lodine if the medication you gave her didn't work.

## 2017-03-16 NOTE — Telephone Encounter (Signed)
Steroids are the only other medicine.  She can try otc advil or aleve,  If she wants the steroid I will call it in

## 2017-03-16 NOTE — Telephone Encounter (Signed)
The only other option is steroids.  She can always try otc aleve or advil

## 2017-03-22 ENCOUNTER — Other Ambulatory Visit: Payer: Self-pay | Admitting: Physician Assistant

## 2017-03-23 ENCOUNTER — Other Ambulatory Visit: Payer: Self-pay | Admitting: Physician Assistant

## 2017-03-23 NOTE — Telephone Encounter (Signed)
Patient contacted office stated that the voltaren is working would like a refill

## 2017-03-23 NOTE — Telephone Encounter (Signed)
Med refill for voltaren approved , pt states the medication has started helping with her knee pain, is going out of town and is worried she will run out of medication

## 2017-04-04 ENCOUNTER — Encounter: Payer: Self-pay | Admitting: Physician Assistant

## 2017-04-04 ENCOUNTER — Ambulatory Visit: Payer: Self-pay | Admitting: Physician Assistant

## 2017-04-04 VITALS — BP 139/80 | HR 89 | Temp 98.5°F | Resp 16 | Ht 63.0 in | Wt 196.0 lb

## 2017-04-04 DIAGNOSIS — R319 Hematuria, unspecified: Secondary | ICD-10-CM

## 2017-04-04 DIAGNOSIS — Z008 Encounter for other general examination: Secondary | ICD-10-CM

## 2017-04-04 DIAGNOSIS — R300 Dysuria: Secondary | ICD-10-CM

## 2017-04-04 DIAGNOSIS — Z0189 Encounter for other specified special examinations: Secondary | ICD-10-CM

## 2017-04-04 DIAGNOSIS — N390 Urinary tract infection, site not specified: Secondary | ICD-10-CM

## 2017-04-04 DIAGNOSIS — M25562 Pain in left knee: Secondary | ICD-10-CM

## 2017-04-04 LAB — POCT URINALYSIS DIPSTICK
Glucose, UA: NEGATIVE
KETONES UA: NEGATIVE
Nitrite, UA: NEGATIVE
PH UA: 5.5 (ref 5.0–8.0)
Urobilinogen, UA: 0.2 E.U./dL

## 2017-04-04 MED ORDER — FLUCONAZOLE 150 MG PO TABS: ORAL_TABLET | ORAL | 0 refills | Status: DC

## 2017-04-04 MED ORDER — METHYLPREDNISOLONE 4 MG PO TBPK: ORAL_TABLET | ORAL | 0 refills | Status: DC

## 2017-04-04 MED ORDER — CIPROFLOXACIN HCL 250 MG PO TABS: 250.0000 mg | ORAL_TABLET | Freq: Two times a day (BID) | ORAL | 0 refills | Status: DC

## 2017-04-04 NOTE — Progress Notes (Signed)
S: pt here for biometrics, was seen by Dr Birdie Sonssonnenberg in Aug of 2017 for new pt visit, will count this as the wellness physical, today she has knee pain, states the podiatrist wrapped her feet and it made her walk differently so her left knee began to swell, also some dysuria, worried she has a uti Denies fever/chills/back pain or vag discharge  O: vitals wnl, nad, skin intact no redness or bruising at knee, full rom, tender at medial aspect of patella and joint line with small amount of swelling, n/v intact, ua 1+ leuks  A: uti, knee pain, biometrics  P: cipro, medrol dose pack, diflucan

## 2017-04-05 LAB — CMP12+LP+TP+TSH+6AC+CBC/D/PLT
ALBUMIN: 4.3 g/dL (ref 3.5–5.5)
ALT: 19 IU/L (ref 0–32)
AST: 21 IU/L (ref 0–40)
Albumin/Globulin Ratio: 1.7 (ref 1.2–2.2)
Alkaline Phosphatase: 83 IU/L (ref 39–117)
BASOS: 0 %
BUN/Creatinine Ratio: 16 (ref 9–23)
BUN: 13 mg/dL (ref 6–24)
Basophils Absolute: 0 10*3/uL (ref 0.0–0.2)
Bilirubin Total: 0.4 mg/dL (ref 0.0–1.2)
CALCIUM: 8.9 mg/dL (ref 8.7–10.2)
CHLORIDE: 107 mmol/L — AB (ref 96–106)
CHOL/HDL RATIO: 6.3 ratio — AB (ref 0.0–4.4)
CREATININE: 0.83 mg/dL (ref 0.57–1.00)
Cholesterol, Total: 246 mg/dL — ABNORMAL HIGH (ref 100–199)
EOS (ABSOLUTE): 0.2 10*3/uL (ref 0.0–0.4)
ESTIMATED CHD RISK: 1.8 times avg. — AB (ref 0.0–1.0)
Eos: 2 %
Free Thyroxine Index: 1.9 (ref 1.2–4.9)
GFR calc Af Amer: 98 mL/min/{1.73_m2} (ref >59)
GFR, EST NON AFRICAN AMERICAN: 85 mL/min/{1.73_m2} (ref >59)
GGT: 44 IU/L (ref 0–60)
GLOBULIN, TOTAL: 2.5 g/dL (ref 1.5–4.5)
Glucose: 94 mg/dL (ref 65–99)
HDL: 39 mg/dL — ABNORMAL LOW (ref >39)
HEMATOCRIT: 41.9 % (ref 34.0–46.6)
Hemoglobin: 13.3 g/dL (ref 11.1–15.9)
IMMATURE GRANS (ABS): 0 10*3/uL (ref 0.0–0.1)
Immature Granulocytes: 0 %
Iron: 93 ug/dL (ref 27–159)
LDH: 184 IU/L (ref 119–226)
LDL CALC: 172 mg/dL — AB (ref 0–99)
LYMPHS ABS: 1.4 10*3/uL (ref 0.7–3.1)
Lymphs: 23 %
MCH: 29.5 pg (ref 26.6–33.0)
MCHC: 31.7 g/dL (ref 31.5–35.7)
MCV: 93 fL (ref 79–97)
MONOS ABS: 0.6 10*3/uL (ref 0.1–0.9)
Monocytes: 9 %
NEUTROS ABS: 4 10*3/uL (ref 1.4–7.0)
Neutrophils: 66 %
POTASSIUM: 4.1 mmol/L (ref 3.5–5.2)
Phosphorus: 2.2 mg/dL — ABNORMAL LOW (ref 2.5–4.5)
Platelets: 259 10*3/uL (ref 150–379)
RBC: 4.51 x10E6/uL (ref 3.77–5.28)
RDW: 13.6 % (ref 12.3–15.4)
SODIUM: 140 mmol/L (ref 134–144)
T3 Uptake Ratio: 22 % — ABNORMAL LOW (ref 24–39)
T4 TOTAL: 8.6 ug/dL (ref 4.5–12.0)
TRIGLYCERIDES: 174 mg/dL — AB (ref 0–149)
TSH: 1.19 u[IU]/mL (ref 0.450–4.500)
Total Protein: 6.8 g/dL (ref 6.0–8.5)
Uric Acid: 4.9 mg/dL (ref 2.5–7.1)
VLDL Cholesterol Cal: 35 mg/dL (ref 5–40)
WBC: 6.2 10*3/uL (ref 3.4–10.8)

## 2017-04-05 LAB — VITAMIN D 25 HYDROXY (VIT D DEFICIENCY, FRACTURES): VIT D 25 HYDROXY: 38.7 ng/mL (ref 30.0–100.0)

## 2017-05-03 ENCOUNTER — Other Ambulatory Visit: Payer: Self-pay | Admitting: Family Medicine

## 2017-05-03 NOTE — Telephone Encounter (Signed)
Last OV 05/25/16 last filled 05/25/16 1 4rf

## 2017-05-03 NOTE — Telephone Encounter (Signed)
It has been close to a year since the patient has been seen in the office. Patient needs a follow-up prior to refill. Thanks.

## 2017-05-17 ENCOUNTER — Other Ambulatory Visit: Payer: Self-pay | Admitting: Orthopedic Surgery

## 2017-05-17 ENCOUNTER — Other Ambulatory Visit: Payer: Self-pay | Admitting: Physician Assistant

## 2017-05-17 DIAGNOSIS — G43809 Other migraine, not intractable, without status migrainosus: Secondary | ICD-10-CM

## 2017-05-17 DIAGNOSIS — M25562 Pain in left knee: Secondary | ICD-10-CM

## 2017-05-17 NOTE — Telephone Encounter (Signed)
Med refill for naproxen approved 

## 2017-05-26 ENCOUNTER — Ambulatory Visit
Admission: RE | Admit: 2017-05-26 | Discharge: 2017-05-26 | Disposition: A | Discharge status: Home / Self Care | Payer: 59 | Source: Ambulatory Visit | Attending: Orthopedic Surgery | Admitting: Orthopedic Surgery

## 2017-05-31 ENCOUNTER — Other Ambulatory Visit: Payer: Self-pay | Admitting: Physician Assistant

## 2017-06-01 NOTE — Telephone Encounter (Signed)
Med refill for mobic approved 

## 2017-06-08 ENCOUNTER — Ambulatory Visit
Admission: RE | Admit: 2017-06-08 | Discharge: 2017-06-08 | Disposition: A | Discharge status: Home / Self Care | Payer: Managed Care, Other (non HMO) | Source: Ambulatory Visit | Attending: Orthopedic Surgery | Admitting: Orthopedic Surgery

## 2017-06-08 DIAGNOSIS — S83242A Other tear of medial meniscus, current injury, left knee, initial encounter: Secondary | ICD-10-CM | POA: Insufficient documentation

## 2017-06-08 DIAGNOSIS — X58XXXA Exposure to other specified factors, initial encounter: Secondary | ICD-10-CM | POA: Diagnosis not present

## 2017-06-08 DIAGNOSIS — M25562 Pain in left knee: Secondary | ICD-10-CM | POA: Diagnosis not present

## 2017-06-08 DIAGNOSIS — M948X6 Other specified disorders of cartilage, lower leg: Secondary | ICD-10-CM | POA: Insufficient documentation

## 2017-06-08 DIAGNOSIS — M659 Synovitis and tenosynovitis, unspecified: Secondary | ICD-10-CM | POA: Diagnosis not present

## 2017-06-08 DIAGNOSIS — M25461 Effusion, right knee: Secondary | ICD-10-CM | POA: Insufficient documentation

## 2017-07-11 ENCOUNTER — Other Ambulatory Visit: Payer: Self-pay | Admitting: Family Medicine

## 2017-07-11 ENCOUNTER — Other Ambulatory Visit: Payer: Self-pay | Admitting: Physician Assistant

## 2017-07-11 NOTE — Telephone Encounter (Signed)
Patient needs follow-up scheduled. Please also check with the pharmacy to see if the formulary changed for this medication as it is a different brand. Thanks.

## 2017-07-11 NOTE — Telephone Encounter (Signed)
Please advise for refill, thanks 

## 2017-07-12 NOTE — Telephone Encounter (Signed)
Spoke with Alexandria [name] at [facility], patient has been on this specific one since august of 2017.  Patient had one filling prior to that that was [name] but her insurance required her to be on the [place].  Please advise.

## 2017-07-12 NOTE — Telephone Encounter (Signed)
Refill sent to pharmacy. Patient needs a follow-up appointment scheduled as well. Thanks.

## 2017-07-12 NOTE — Telephone Encounter (Signed)
Med refill request

## 2017-07-21 ENCOUNTER — Other Ambulatory Visit: Payer: Self-pay | Admitting: Physician Assistant

## 2017-07-21 NOTE — Telephone Encounter (Signed)
Med refill for maxalt approved

## 2017-08-31 ENCOUNTER — Encounter
Admission: RE | Admit: 2017-08-31 | Discharge: 2017-08-31 | Disposition: A | Discharge status: Home / Self Care | Payer: Managed Care, Other (non HMO) | Source: Ambulatory Visit | Attending: Orthopedic Surgery | Admitting: Orthopedic Surgery

## 2017-08-31 ENCOUNTER — Other Ambulatory Visit: Payer: Self-pay

## 2017-08-31 HISTORY — DX: Essential (primary) hypertension: I10

## 2017-08-31 NOTE — Patient Instructions (Addendum)
Your procedure is scheduled on: 09/08/17 Report to Day Surgery. MEDICAL MALL SECOND FLOOR To find out your arrival time please call [phone] between 1PM - 3PM on 09/07/17 Remember: Instructions that are not followed completely may result in serious medical risk, up to and including death, or upon the discretion of your surgeon and anesthesiologist your surgery may need to be rescheduled.     _X__ 1. Do not eat food after midnight the night before your procedure.                 No gum chewing or hard candies. You may drink clear liquids up to 2 hours                 before you are scheduled to arrive for your surgery- DO not drink clear                 liquids within 2 hours of the start of your surgery.                 Clear Liquids include:  water, apple juice without pulp, clear carbohydrate                 drink such as Clearfast of Gartorade, Black Coffee or Tea (Do not add                 anything to coffee or tea).     _X__ 2.  No Alcohol for 24 hours before or after surgery.   _X__ 3.  Do Not Smoke or use e-cigarettes For 24 Hours Prior to Your Surgery.                 Do not use any chewable tobacco products for at least 6 hours prior to                 surgery.  ____  4.  Bring all medications with you on the day of surgery if instructed.   __X__  5.  Notify your doctor if there is any change in your medical condition      (cold, fever, infections).     Do not wear jewelry, make-up, hairpins, clips or nail polish. Do not wear lotions, powders, or perfumes. You may wear deodorant. Do not shave 48 hours prior to surgery. Men may shave face and neck. Do not bring valuables to the hospital.    [facility] any belongings or valuables.  Contacts, dentures or bridgework may not be worn into surgery. Leave your suitcase in the car. After surgery it may be brought to your room. For patients admitted to the hospital, discharge time  is determined by your treatment team.   Patients discharged the day of surgery will not be allowed to drive home.   Please read over the following fact sheets that you were given:   Surgical Site Infection Prevention          __X__ Take these medicines the morning of surgery with A SIP OF WATER:    1.AMLODIPINE  2.CELEBREX   3.   4.  5.  6.  ____ Fleet Enema (as directed)   __X__ Use CHG Soap as directed  ____ Use inhalers on the day of surgery  ____ Stop metformin 2 days prior to surgery    ____ Take 1/2 of usual insulin dose the night before surgery. No insulin the morning          of  surgery.   ____ Stop Coumadin/Plavix/aspirin on ____ Stop Anti-inflammatories on   ____ Stop supplements until after surgery.    ____ Bring C-Pap to the hospital.

## 2017-09-05 ENCOUNTER — Other Ambulatory Visit: Payer: Self-pay | Admitting: Physician Assistant

## 2017-09-05 ENCOUNTER — Encounter
Admission: RE | Admit: 2017-09-05 | Discharge: 2017-09-05 | Disposition: A | Discharge status: Home / Self Care | Payer: Managed Care, Other (non HMO) | Source: Ambulatory Visit | Attending: Orthopedic Surgery | Admitting: Orthopedic Surgery

## 2017-09-05 ENCOUNTER — Telehealth: Payer: Self-pay

## 2017-09-05 DIAGNOSIS — X58XXXA Exposure to other specified factors, initial encounter: Secondary | ICD-10-CM | POA: Diagnosis not present

## 2017-09-05 DIAGNOSIS — Z79899 Other long term (current) drug therapy: Secondary | ICD-10-CM | POA: Diagnosis not present

## 2017-09-05 DIAGNOSIS — Z888 Allergy status to other drugs, medicaments and biological substances status: Secondary | ICD-10-CM | POA: Diagnosis not present

## 2017-09-05 DIAGNOSIS — S83242A Other tear of medial meniscus, current injury, left knee, initial encounter: Secondary | ICD-10-CM | POA: Diagnosis not present

## 2017-09-05 DIAGNOSIS — M1712 Unilateral primary osteoarthritis, left knee: Secondary | ICD-10-CM | POA: Diagnosis not present

## 2017-09-05 DIAGNOSIS — Z882 Allergy status to sulfonamides status: Secondary | ICD-10-CM | POA: Diagnosis not present

## 2017-09-05 DIAGNOSIS — M65862 Other synovitis and tenosynovitis, left lower leg: Secondary | ICD-10-CM | POA: Diagnosis not present

## 2017-09-05 DIAGNOSIS — M6752 Plica syndrome, left knee: Secondary | ICD-10-CM | POA: Diagnosis not present

## 2017-09-05 DIAGNOSIS — I10 Essential (primary) hypertension: Secondary | ICD-10-CM | POA: Diagnosis not present

## 2017-09-05 DIAGNOSIS — Z880 Allergy status to penicillin: Secondary | ICD-10-CM | POA: Diagnosis not present

## 2017-09-05 NOTE — Pre-Procedure Instructions (Signed)
As instructed by dr Domenica Reamerp carroll, ekg called and faxed to dr Birdie Sonssonnenberg. Also faxed to dr menz's office

## 2017-09-05 NOTE — Patient Instructions (Signed)
Your procedure is scheduled on 09/08/17 Report to Day Surgery. MEDICAL MALL SECOND FLOOR To find out your arrival time please call [phone] between 1PM - 3PM on 09/07/17  Remember: Instructions that are not followed completely may result in serious medical risk, up to and including death, or upon the discretion of your surgeon and anesthesiologist your surgery may need to be rescheduled.     _X__ 1. Do not eat food after midnight the night before your procedure.                 No gum chewing or hard candies. You may drink clear liquids up to 2 hours                 before you are scheduled to arrive for your surgery- DO not drink clear                 liquids within 2 hours of the start of your surgery.                 Clear Liquids include:  water, apple juice without pulp, clear carbohydrate                 drink such as Clearfast of Gartorade, Black Coffee or Tea (Do not add                 anything to coffee or tea).     _X__ 2.  No Alcohol for 24 hours before or after surgery.   _X__ 3.  Do Not Smoke or use e-cigarettes For 24 Hours Prior to Your Surgery.                 Do not use any chewable tobacco products for at least 6 hours prior to                 surgery.  ____  4.  Bring all medications with you on the day of surgery if instructed.   __X__  5.  Notify your doctor if there is any change in your medical condition      (cold, fever, infections).     Do not wear jewelry, make-up, hairpins, clips or nail polish. Do not wear lotions, powders, or perfumes. You may wear deodorant. Do not shave 48 hours prior to surgery. Men may shave face and neck. Do not bring valuables to the hospital.    [facility] not [facility] for any belongings or valuables.  Contacts, dentures or bridgework may not be worn into surgery. Leave your suitcase in the car. After surgery it may be brought to your room. For patients admitted to the hospital, discharge time  is determined by your treatment team.   Patients discharged the day of surgery will not be allowed to drive home.   Please read over the following fact sheets that you were given:   MRSA Information and Surgical Site Infection Prevention          ____ Take these medicines the morning of surgery with A SIP OF WATER:    1.   2.   3.   4.  5.  6.  ____ Fleet Enema (as directed)   _X___ Use CHG Soap as directed  ____ Use inhalers on the day of surgery  ____ Stop metformin 2 days prior to surgery    ____ Take 1/2 of usual insulin dose the night before surgery. No insulin the morning  of surgery.   ____ Stop Coumadin/Plavix/aspirin on  ____ Stop Anti-inflammatories on   ____ Stop supplements until after surgery.    ____ Bring C-Pap to the hospital.

## 2017-09-05 NOTE — Telephone Encounter (Signed)
[facility] and also patient has not been seen since 05/2016 and it was only one acute visit.Informed her patent will need to establish care

## 2017-09-05 NOTE — Telephone Encounter (Signed)
Copied from CRM [id number]#[id number]. Topic: General - Other >> Sep 05, 2017  1:56 PM Gerrianne ScalePayne, Angela L wrote: Reason for CRM: Lannette [facility]Kacie [facility] calling to see if pt clearance Form for surgery is fill out patient is having surgery on Friday all the form need to say is that its ok to proceed with surgery patient had an abnormal EKG if you need to get in touch with Lannette DonathKacie her number is [phone]

## 2017-09-06 ENCOUNTER — Telehealth: Payer: Self-pay

## 2017-09-06 NOTE — Telephone Encounter (Signed)
Med request attached.

## 2017-09-06 NOTE — Telephone Encounter (Signed)
Called patient to schedule surgery clearance for 09/07/17 230, Left message to return call, ok for [facility] to speak to patient and get her scheduled for given time and date.

## 2017-09-07 NOTE — Telephone Encounter (Signed)
[facility]

## 2017-09-07 NOTE — Pre-Procedure Instructions (Signed)
CLEARED BY DR CALLWOOD MILD RISK 09/06/17

## 2017-09-07 NOTE — Telephone Encounter (Signed)
Left message with pt. That she has what she needs for up coming surgery.

## 2017-09-08 ENCOUNTER — Ambulatory Visit
Admission: RE | Admit: 2017-09-08 | Discharge: 2017-09-08 | Disposition: A | Discharge status: Home / Self Care | Payer: Managed Care, Other (non HMO) | Source: Ambulatory Visit | Attending: Orthopedic Surgery | Admitting: Orthopedic Surgery

## 2017-09-08 ENCOUNTER — Other Ambulatory Visit: Payer: Self-pay

## 2017-09-08 ENCOUNTER — Ambulatory Visit: Payer: Managed Care, Other (non HMO) | Admitting: Anesthesiology

## 2017-09-08 ENCOUNTER — Encounter
Admission: RE | Disposition: A | Discharge status: Home / Self Care | Payer: Self-pay | Source: Ambulatory Visit | Attending: Orthopedic Surgery

## 2017-09-08 DIAGNOSIS — M1712 Unilateral primary osteoarthritis, left knee: Secondary | ICD-10-CM | POA: Diagnosis not present

## 2017-09-08 DIAGNOSIS — Z882 Allergy status to sulfonamides status: Secondary | ICD-10-CM | POA: Insufficient documentation

## 2017-09-08 DIAGNOSIS — Z79899 Other long term (current) drug therapy: Secondary | ICD-10-CM | POA: Insufficient documentation

## 2017-09-08 DIAGNOSIS — X58XXXA Exposure to other specified factors, initial encounter: Secondary | ICD-10-CM | POA: Insufficient documentation

## 2017-09-08 DIAGNOSIS — M6752 Plica syndrome, left knee: Secondary | ICD-10-CM | POA: Insufficient documentation

## 2017-09-08 DIAGNOSIS — M65862 Other synovitis and tenosynovitis, left lower leg: Secondary | ICD-10-CM | POA: Insufficient documentation

## 2017-09-08 DIAGNOSIS — I10 Essential (primary) hypertension: Secondary | ICD-10-CM | POA: Insufficient documentation

## 2017-09-08 DIAGNOSIS — Z880 Allergy status to penicillin: Secondary | ICD-10-CM | POA: Insufficient documentation

## 2017-09-08 DIAGNOSIS — S83242A Other tear of medial meniscus, current injury, left knee, initial encounter: Secondary | ICD-10-CM | POA: Insufficient documentation

## 2017-09-08 DIAGNOSIS — Z888 Allergy status to other drugs, medicaments and biological substances status: Secondary | ICD-10-CM | POA: Insufficient documentation

## 2017-09-08 HISTORY — PX: SYNOVECTOMY: SHX5180

## 2017-09-08 HISTORY — PX: KNEE ARTHROSCOPY WITH MEDIAL MENISECTOMY: SHX5651

## 2017-09-08 LAB — POCT PREGNANCY, URINE: PREG TEST UR: NEGATIVE

## 2017-09-08 SURGERY — ARTHROSCOPY, KNEE, WITH MEDIAL MENISCECTOMY
Anesthesia: General | Laterality: Left | Wound class: Clean

## 2017-09-08 MED ORDER — CEFAZOLIN SODIUM-DEXTROSE 2-4 GM/100ML-% IV SOLN
INTRAVENOUS | Status: AC
  Filled 2017-09-08: qty 100

## 2017-09-08 MED ORDER — ONDANSETRON HCL 4 MG/2ML IJ SOLN: 4.0000 mg | Freq: Once | INTRAMUSCULAR | Status: DC | PRN

## 2017-09-08 MED ORDER — FENTANYL CITRATE (PF) 100 MCG/2ML IJ SOLN
25.0000 ug | INTRAMUSCULAR | Status: DC | PRN
  Administered 2017-09-08 (×4): 25 ug via INTRAVENOUS

## 2017-09-08 MED ORDER — MIDAZOLAM HCL 2 MG/2ML IJ SOLN
INTRAMUSCULAR | Status: DC | PRN
  Administered 2017-09-08: 2 mg via INTRAVENOUS

## 2017-09-08 MED ORDER — ACETAMINOPHEN 10 MG/ML IV SOLN
INTRAVENOUS | Status: AC
  Filled 2017-09-08: qty 100

## 2017-09-08 MED ORDER — MIDAZOLAM HCL 2 MG/2ML IJ SOLN
INTRAMUSCULAR | Status: AC
  Filled 2017-09-08: qty 2

## 2017-09-08 MED ORDER — KETOROLAC TROMETHAMINE 30 MG/ML IJ SOLN
INTRAMUSCULAR | Status: AC
  Filled 2017-09-08: qty 1

## 2017-09-08 MED ORDER — FENTANYL CITRATE (PF) 100 MCG/2ML IJ SOLN
INTRAMUSCULAR | Status: DC | PRN
  Administered 2017-09-08: 100 ug via INTRAVENOUS
  Administered 2017-09-08 (×2): 50 ug via INTRAVENOUS

## 2017-09-08 MED ORDER — HYDROCODONE-ACETAMINOPHEN 5-325 MG PO TABS: 1.0000 | ORAL_TABLET | Freq: Four times a day (QID) | ORAL | 0 refills | Status: DC | PRN

## 2017-09-08 MED ORDER — BUPIVACAINE-EPINEPHRINE (PF) 0.5% -1:200000 IJ SOLN
INTRAMUSCULAR | Status: DC | PRN
  Administered 2017-09-08: 20 mL via PERINEURAL

## 2017-09-08 MED ORDER — PROPOFOL 10 MG/ML IV BOLUS
INTRAVENOUS | Status: DC | PRN
  Administered 2017-09-08: 200 mg via INTRAVENOUS

## 2017-09-08 MED ORDER — KETOROLAC TROMETHAMINE 30 MG/ML IJ SOLN
INTRAMUSCULAR | Status: DC | PRN
  Administered 2017-09-08: 30 mg via INTRAVENOUS

## 2017-09-08 MED ORDER — FENTANYL CITRATE (PF) 100 MCG/2ML IJ SOLN
INTRAMUSCULAR | Status: AC
  Filled 2017-09-08: qty 2

## 2017-09-08 MED ORDER — FENTANYL CITRATE (PF) 100 MCG/2ML IJ SOLN
INTRAMUSCULAR | Status: AC
  Administered 2017-09-08: 25 ug via INTRAVENOUS
  Filled 2017-09-08: qty 2

## 2017-09-08 MED ORDER — FAMOTIDINE 20 MG PO TABS
20.0000 mg | ORAL_TABLET | Freq: Once | ORAL | Status: AC
  Administered 2017-09-08: 20 mg via ORAL

## 2017-09-08 MED ORDER — CEFAZOLIN SODIUM-DEXTROSE 2-4 GM/100ML-% IV SOLN: 2.0000 g | Freq: Once | INTRAVENOUS | Status: DC

## 2017-09-08 MED ORDER — ONDANSETRON HCL 4 MG/2ML IJ SOLN
INTRAMUSCULAR | Status: DC | PRN
  Administered 2017-09-08: 4 mg via INTRAVENOUS

## 2017-09-08 MED ORDER — LIDOCAINE HCL (PF) 2 % IJ SOLN
INTRAMUSCULAR | Status: AC
  Filled 2017-09-08: qty 10

## 2017-09-08 MED ORDER — DEXAMETHASONE SODIUM PHOSPHATE 10 MG/ML IJ SOLN
INTRAMUSCULAR | Status: AC
  Filled 2017-09-08: qty 1

## 2017-09-08 MED ORDER — ONDANSETRON HCL 4 MG/2ML IJ SOLN
INTRAMUSCULAR | Status: AC
  Filled 2017-09-08: qty 2

## 2017-09-08 MED ORDER — FAMOTIDINE 20 MG PO TABS
ORAL_TABLET | ORAL | Status: AC
  Administered 2017-09-08: 20 mg via ORAL
  Filled 2017-09-08: qty 1

## 2017-09-08 MED ORDER — LIDOCAINE HCL (CARDIAC) 20 MG/ML IV SOLN
INTRAVENOUS | Status: DC | PRN
  Administered 2017-09-08: 100 mg via INTRAVENOUS

## 2017-09-08 MED ORDER — BUPIVACAINE-EPINEPHRINE (PF) 0.5% -1:200000 IJ SOLN
INTRAMUSCULAR | Status: AC
  Filled 2017-09-08: qty 30

## 2017-09-08 MED ORDER — DEXAMETHASONE SODIUM PHOSPHATE 10 MG/ML IJ SOLN
INTRAMUSCULAR | Status: DC | PRN
  Administered 2017-09-08: 10 mg via INTRAVENOUS

## 2017-09-08 MED ORDER — PROPOFOL 10 MG/ML IV BOLUS
INTRAVENOUS | Status: AC
  Filled 2017-09-08: qty 20

## 2017-09-08 MED ORDER — ACETAMINOPHEN 10 MG/ML IV SOLN
INTRAVENOUS | Status: DC | PRN
  Administered 2017-09-08: 1000 mg via INTRAVENOUS

## 2017-09-08 MED ORDER — LACTATED RINGERS IV SOLN
INTRAVENOUS | Status: DC
  Administered 2017-09-08: 11:00:00 via INTRAVENOUS

## 2017-09-08 SURGICAL SUPPLY — 23 items
BANDAGE ACE 4X5 VEL STRL LF (GAUZE/BANDAGES/DRESSINGS) ×2 IMPLANT
BLADE INCISOR PLUS 4.5 (BLADE) ×2 IMPLANT
CHLORAPREP W/TINT 26ML (MISCELLANEOUS) ×2 IMPLANT
CUFF TOURN 24 STER (MISCELLANEOUS) ×2 IMPLANT
GAUZE SPONGE 4X4 12PLY STRL (GAUZE/BANDAGES/DRESSINGS) ×2 IMPLANT
GLOVE SURG SYN 9.0  PF PI (GLOVE) ×1
GLOVE SURG SYN 9.0 PF PI (GLOVE) ×1 IMPLANT
GOWN SRG 2XL LVL 4 RGLN SLV (GOWNS) ×1 IMPLANT
GOWN STRL NON-REIN 2XL LVL4 (GOWNS) ×1
GOWN STRL REUS W/ TWL LRG LVL3 (GOWN DISPOSABLE) ×2 IMPLANT
GOWN STRL REUS W/TWL LRG LVL3 (GOWN DISPOSABLE) ×2
IV LACTATED RINGER IRRG 3000ML (IV SOLUTION) ×2
IV LR IRRIG 3000ML ARTHROMATIC (IV SOLUTION) ×2 IMPLANT
KIT RM TURNOVER STRD PROC AR (KITS) ×2 IMPLANT
MANIFOLD NEPTUNE II (INSTRUMENTS) ×2 IMPLANT
PACK ARTHROSCOPY KNEE (MISCELLANEOUS) ×2 IMPLANT
SET TUBE SUCT SHAVER OUTFL 24K (TUBING) ×2 IMPLANT
SET TUBE TIP INTRA-ARTICULAR (MISCELLANEOUS) ×2 IMPLANT
SUT ETHILON 4-0 (SUTURE) ×1
SUT ETHILON 4-0 FS2 18XMFL BLK (SUTURE) ×1
SUTURE ETHLN 4-0 FS2 18XMF BLK (SUTURE) ×1 IMPLANT
TUBING ARTHRO INFLOW-ONLY STRL (TUBING) ×2 IMPLANT
WAND COBLATION FLOW 50 (SURGICAL WAND) ×2 IMPLANT

## 2017-09-08 NOTE — Op Note (Signed)
09/08/2017  12:45 PM  PATIENT:  Alexandria [name]  46 y.o. female  PRE-OPERATIVE DIAGNOSIS:  ACUTE MEDIAL MENISCUS TEAR LEFT, osteoarthritis  POST-OPERATIVE DIAGNOSIS:  ACUTE MEDIAL MENISCUS TEAR LEFT, osteoarthritis, plica band with synovitis  PROCEDURE:  Procedure(s): KNEE ARTHROSCOPY WITH PARTIAL MEDIAL MENISECTOMY (Left) PartiaL SYNOVECTOMY (Left)  SURGEON: Leitha SchullerMichael J Tamaria Dunleavy, MD  ASSISTANTS: None  ANESTHESIA:   general  EBL:  Total I/O In: -  Out: 3 [Blood:3]  BLOOD ADMINISTERED:none  DRAINS: none   LOCAL MEDICATIONS USED:  MARCAINE     SPECIMEN:  No Specimen  DISPOSITION OF SPECIMEN:  N/A  COUNTS:  YES  TOURNIQUET:  * Missing tourniquet times found for documented tourniquets in log: [id number] *12 minutes at 300 mmHg  IMPLANTS: None  DICTATION: .Dragon Dictation patient brought the operating room and after adequate anesthesia was obtained the left leg was placed in arthroscopic leg holder with tourniquet applied. After prepping and draping in the usual sterile manner, appropriate patient identification and timeout procedures were completed. Inferolateral portal was made on initial inspection there is significant synovitis with multiple small cartilage loose bodies that had attached to the synovium present this was in the suprapatellar pouch. The was significant patellofemoral arthritis with some exposed bone in the central aspect of the patella. Corral medial compartment is a thick medial plica and on coming to the medial compartment there is exposed bone over most the femoral condyle and the central aspect of the tibial plateau medially. An inferior medial portal was made and on probing this Compaqs tear of the posterior horn of the meniscus extended to the middle third. Going to the notch the anterior cruciate ligament was intact and lateral compartment was essentially normal. At this point a meniscal Punch shaver and ArthriCare wand were used to debride the meniscus back to  stable margin. Articular cartilage on the patella was also smoothed with the chondroplasty is was a partial chondroplasty around the periphery of the articular exposed bone and a shaver is used to debride the synovitis in the suprapatellar pouch as well as the plica this was done with the tourniquet up to minimize bleeding and improve visualization. After all of this of been completed argentation was withdrawn and 20 cc of half percent Sensorcaine with epinephrine was a low traded of the portals. 4-0 nylon used as a proper fashion close the portals followed by Xeroform 4 x 4's web roll and Ace wrap  PLAN OF CARE: Discharge to home after PACU  PATIENT DISPOSITION:  PACU - hemodynamically stable.

## 2017-09-08 NOTE — Transfer of Care (Signed)
Immediate Anesthesia Transfer of Care Note  Patient: Garnet SierrasDebbie J Stangl  Procedure(s) Performed: KNEE ARTHROSCOPY WITH PARTIAL MEDIAL MENISECTOMY (Left ) PartiaL SYNOVECTOMY (Left )  Patient Location: PACU  Anesthesia Type:General  Level of Consciousness: sedated  Airway & Oxygen Therapy: Patient Spontanous Breathing and Patient connected to face mask oxygen  Post-op Assessment: Report given to RN and Post -op Vital signs reviewed and stable  Post vital signs: Reviewed and stable  Last Vitals:  Vitals:   09/08/17 1019  BP: (!) 166/98  Pulse: 95  Resp: 16  Temp: 36.7 C  SpO2: 100%    Last Pain:  Vitals:   09/08/17 1021  TempSrc:   PainSc: 8          Complications: No apparent anesthesia complications

## 2017-09-08 NOTE — Anesthesia Procedure Notes (Signed)
Procedure Name: LMA Insertion Date/Time: 09/08/2017 12:00 PM Performed by: Junious SilkNoles, Yousaf Sainato, CRNA Pre-anesthesia Checklist: Patient identified, Patient being monitored, Timeout performed, Emergency Drugs available and Suction available Patient Re-evaluated:Patient Re-evaluated prior to induction Oxygen Delivery Method: Circle system utilized Preoxygenation: Pre-oxygenation with 100% oxygen Induction Type: IV induction Ventilation: Mask ventilation without difficulty LMA: LMA inserted LMA Size: 3.5 Tube type: Oral Number of attempts: 1 Placement Confirmation: positive ETCO2 and breath sounds checked- equal and bilateral Tube secured with: Tape Dental Injury: Teeth and Oropharynx as per pre-operative assessment

## 2017-09-08 NOTE — Discharge Instructions (Addendum)
Keep dressing clean and dry. Pain medicine as directed. Aspirin 81 mg twice a day. Try to do minimal activity through the weekend weightbearing as tolerated on the left leg.   AMBULATORY SURGERY  DISCHARGE INSTRUCTIONS   1) The drugs that you were given will stay in your system until tomorrow so for the next 24 hours you should not:  A) Drive an automobile B) Make any legal decisions C) Drink any alcoholic beverage   2) You may resume regular meals tomorrow.  Today it is better to start with liquids and gradually work up to solid foods.  You may eat anything you prefer, but it is better to start with liquids, then soup and crackers, and gradually work up to solid foods.   3) Please notify your doctor immediately if you have any unusual bleeding, trouble breathing, redness and pain at the surgery site, drainage, fever, or pain not relieved by medication.    4) Additional Instructions:   Please contact your physician with any problems or Same Day Surgery at [phone], Monday through Friday 6 am to 4 pm, or [place] [facility] Main number at [phone].

## 2017-09-08 NOTE — Anesthesia Postprocedure Evaluation (Signed)
Anesthesia Post Note  Patient: Alexandria SierrasDebbie J [name]  Procedure(s) Performed: KNEE ARTHROSCOPY WITH PARTIAL MEDIAL MENISECTOMY (Left ) PartiaL SYNOVECTOMY (Left )  Patient location during evaluation: PACU Anesthesia Type: General Level of consciousness: awake and alert Pain management: pain level controlled Vital Signs Assessment: post-procedure vital signs reviewed and stable Respiratory status: spontaneous breathing and respiratory function stable Cardiovascular status: stable Anesthetic complications: no     Last Vitals:  Vitals:   09/08/17 1019 09/08/17 1252  BP: (!) 166/98 123/72  Pulse: 95 65  Resp: 16 15  Temp: 36.7 C 36.9 C  SpO2: 100% 97%    Last Pain:  Vitals:   09/08/17 1021  TempSrc:   PainSc: 8                  KEPHART,WILLIAM K

## 2017-09-08 NOTE — Anesthesia Post-op Follow-up Note (Signed)
Anesthesia QCDR form completed.        

## 2017-09-08 NOTE — H&P (Signed)
Reviewed paper H+P, will be scanned into chart. No changes noted.  

## 2017-09-08 NOTE — Anesthesia Preprocedure Evaluation (Signed)
Anesthesia Evaluation  Patient identified by MRN, date of birth, ID band Patient awake    Reviewed: Allergy & Precautions, NPO status , Patient's Chart, lab work & pertinent test results  History of Anesthesia Complications Negative for: history of anesthetic complications  Airway Mallampati: III       Dental   Pulmonary neg sleep apnea, neg COPD,           Cardiovascular hypertension, Pt. on medications and Pt. on home beta blockers (-) Past MI and (-) CHF (-) dysrhythmias (-) Valvular Problems/Murmurs     Neuro/Psych neg Seizures    GI/Hepatic Neg liver ROS, neg GERD  ,  Endo/Other  neg diabetes  Renal/GU negative Renal ROS     Musculoskeletal   Abdominal   Peds  Hematology   Anesthesia Other Findings   Reproductive/Obstetrics                             Anesthesia Physical Anesthesia Plan  ASA: II  Anesthesia Plan: General   Post-op Pain Management:    Induction: Intravenous  PONV Risk Score and Plan: 3 and Dexamethasone, Ondansetron, Midazolam and Treatment may vary due to age or medical condition  Airway Management Planned: LMA  Additional Equipment:   Intra-op Plan:   Post-operative Plan:   Informed Consent: I have reviewed the patients History and Physical, chart, labs and discussed the procedure including the risks, benefits and alternatives for the proposed anesthesia with the patient or authorized representative who has indicated his/her understanding and acceptance.     Plan Discussed with:   Anesthesia Plan Comments:         Anesthesia Quick Evaluation

## 2017-09-09 ENCOUNTER — Encounter: Payer: Self-pay | Admitting: Orthopedic Surgery

## 2017-09-12 MED ORDER — DICLOFENAC SODIUM 75 MG PO TBEC: 75.0000 mg | DELAYED_RELEASE_TABLET | Freq: Two times a day (BID) | ORAL | 0 refills | Status: DC

## 2017-09-22 ENCOUNTER — Encounter: Payer: Self-pay | Admitting: Physician Assistant

## 2017-09-22 ENCOUNTER — Ambulatory Visit: Payer: Self-pay | Admitting: Physician Assistant

## 2017-09-22 VITALS — BP 160/84 | HR 96 | Temp 98.3°F

## 2017-09-22 DIAGNOSIS — A084 Viral intestinal infection, unspecified: Secondary | ICD-10-CM

## 2017-09-22 NOTE — Progress Notes (Signed)
   Subjective: Vomiting/ diarrhea     Patient ID: Alexandria [name], female    DOB: [birth year], 46 y.o.   MRN: [id number]  HPI Patient awakened this morning with nausea, vomiting, diarrhea. Patient state 3 episodes of vomiting and 2 episodes of loose stools since a.m. awakening. Patient denies fever or URI signs symptoms. No palliative measures for complaint. Patient left work at the second episode of diarrhea.   Review of Systems Hypertension    Objective:   Physical Exam HEENT is unremarkable. Neck is supple without adenopathy. Lungs are clear to auscultation heart regular rate and rhythm. Patient has hyperactive bowel sounds but negative HSM. Abdomen is soft and nontender palpation.       Assessment & Plan: Viral gastroenteritis   Patient discharged care instructions and a prescription for Phenergan and Lomotil. Patient given a work note for today and advised follow-up PCP if condition persists.

## 2017-10-28 ENCOUNTER — Encounter: Payer: Self-pay | Admitting: Medical

## 2017-10-28 ENCOUNTER — Ambulatory Visit: Payer: Self-pay | Admitting: Medical

## 2017-10-28 VITALS — BP 140/80 | HR 94 | Temp 98.3°F | Resp 16

## 2017-10-28 DIAGNOSIS — B373 Candidiasis of vulva and vagina: Secondary | ICD-10-CM

## 2017-10-28 DIAGNOSIS — E559 Vitamin D deficiency, unspecified: Secondary | ICD-10-CM

## 2017-10-28 DIAGNOSIS — I10 Essential (primary) hypertension: Secondary | ICD-10-CM

## 2017-10-28 DIAGNOSIS — R0981 Nasal congestion: Secondary | ICD-10-CM

## 2017-10-28 DIAGNOSIS — Z760 Encounter for issue of repeat prescription: Secondary | ICD-10-CM

## 2017-10-28 DIAGNOSIS — B3731 Acute candidiasis of vulva and vagina: Secondary | ICD-10-CM

## 2017-10-28 DIAGNOSIS — J0100 Acute maxillary sinusitis, unspecified: Secondary | ICD-10-CM

## 2017-10-28 MED ORDER — AZITHROMYCIN 250 MG PO TABS: ORAL_TABLET | ORAL | 0 refills | Status: DC

## 2017-10-28 MED ORDER — FLUCONAZOLE 150 MG PO TABS: 150.0000 mg | ORAL_TABLET | Freq: Once | ORAL | 0 refills | Status: AC

## 2017-10-28 MED ORDER — FLUTICASONE PROPIONATE 50 MCG/ACT NA SUSP: 2.0000 | Freq: Every day | NASAL | 6 refills | Status: DC

## 2017-10-28 MED ORDER — AMLODIPINE BESYLATE 5 MG PO TABS: 5.0000 mg | ORAL_TABLET | Freq: Every day | ORAL | 0 refills | Status: DC

## 2017-10-28 NOTE — Progress Notes (Signed)
Subjective:    Patient ID: Alexandria [name], female    DOB: 11-Aug-1971, 47 y.o.   MRN: [id number]  HPI 47 yo female in nonacute distress with  2 days sinus infection and diarrhea this morning which stopped at  2:30 pm today.  Denies fever or chills, no chest pain or shortness of breath. Works in Physiological scientist.  Blood pressure 140/80, pulse 94, temperature 98.3 F (36.8 C), temperature source Oral, resp. rate 16, SpO2 97 %, unknown if currently breastfeeding. Review of Systems  Constitutional: Negative for chills and fever.  HENT: Positive for congestion, ear pain (due to injury on the right side with weather pressure changes , injury many years ago), postnasal drip, rhinorrhea, sinus pressure and sinus pain. Negative for sore throat.   Respiratory: Positive for cough (mild from PND per patient).   Cardiovascular: Negative for chest pain.  Gastrointestinal: Positive for diarrhea (this morning only , resolved now.). Negative for abdominal pain, nausea and vomiting.  Skin: Negative for rash.  Allergic/Immunologic: Positive for environmental allergies. Negative for food allergies.  Neurological: Negative for dizziness, syncope and headaches.  Hematological: Negative for adenopathy.       Objective:   Physical Exam  Constitutional: She is oriented to person, place, and time. She appears well-developed and well-nourished.  HENT:  Head: Normocephalic and atraumatic.  Right Ear: External ear normal.  Left Ear: External ear normal.  Nose: Mucosal edema and rhinorrhea present.  Mouth/Throat: Oropharynx is clear and moist.  Neck: Normal range of motion. Neck supple.  Cardiovascular: Normal rate, regular rhythm and normal heart sounds.  Pulmonary/Chest: Effort normal and breath sounds normal.  Lymphadenopathy:    She has no cervical adenopathy.  Neurological: She is alert and oriented to person, place, and time.  Skin: Skin is warm and dry.  Psychiatric: She has a  normal mood and affect. Her behavior is normal. Judgment and thought content normal.  Nursing note and vitals reviewed.   Yellow/green discharge left nare, turbinated edema and erythema Sore right side inside nare medial side.     Assessment & Plan:  Sinusitis / diarrhea Hypertension, Vit D deficiency, refill on medications vulvo candidiasis CBC, Met C , vit D orders placed in Epic Tramadol to be filled by prescribing doctor. Patient did not have time today to do blood work will come back on Tuesday. Asks for work note had to leave early due to diarrhea.  Would also like RA testing mother and grandmother both with Rhematoid Arthritis. Meds ordered this encounter  Medications  . azithromycin (ZITHROMAX) 250 MG tablet    Sig: Take 2 tablets by mouth day 1 then one tablet days 2-5 , take with food    Dispense:  6 tablet    Refill:  0  . amLODipine (NORVASC) 5 MG tablet    Sig: Take 1 tablet (5 mg total) by mouth daily.    Dispense:  30 tablet    Refill:  0  . fluticasone (FLONASE) 50 MCG/ACT nasal spray    Sig: Place 2 sprays into both nostrils daily.    Dispense:  16 g    Refill:  6  . fluconazole (DIFLUCAN) 150 MG tablet    Sig: Take 1 tablet (150 mg total) by mouth once for 1 dose.    Dispense:  1 tablet    Refill:  0  Can use nasal saline spray, stop flonase for 7 days due to sore in right nare medial side. Return to the clinic  in 3-5 days if not improving. Patient verbalizes understanding and has no questions at discharge.

## 2017-11-01 ENCOUNTER — Other Ambulatory Visit: Payer: Self-pay | Admitting: Emergency Medicine

## 2017-11-01 ENCOUNTER — Other Ambulatory Visit: Payer: Self-pay

## 2017-11-01 DIAGNOSIS — Z299 Encounter for prophylactic measures, unspecified: Secondary | ICD-10-CM

## 2017-11-01 DIAGNOSIS — Z760 Encounter for issue of repeat prescription: Secondary | ICD-10-CM

## 2017-11-01 DIAGNOSIS — I10 Essential (primary) hypertension: Secondary | ICD-10-CM

## 2017-11-01 DIAGNOSIS — E559 Vitamin D deficiency, unspecified: Secondary | ICD-10-CM

## 2017-11-01 MED ORDER — AMLODIPINE BESYLATE 5 MG PO TABS: 5.0000 mg | ORAL_TABLET | Freq: Every day | ORAL | 3 refills | Status: DC

## 2017-11-01 MED ORDER — VITAMIN D (ERGOCALCIFEROL) 1.25 MG (50000 UNIT) PO CAPS: 50000.0000 [IU] | ORAL_CAPSULE | ORAL | 0 refills | Status: DC

## 2017-11-01 NOTE — Progress Notes (Signed)
Patient came in to have blood drawn for testing [facility].

## 2017-11-02 LAB — CMP12+LP+TP+TSH+6AC+CBC/D/PLT
ALK PHOS: 97 IU/L (ref 39–117)
ALT: 16 IU/L (ref 0–32)
AST: 11 IU/L (ref 0–40)
Albumin/Globulin Ratio: 1.9 (ref 1.2–2.2)
Albumin: 4.4 g/dL (ref 3.5–5.5)
BASOS: 0 %
BUN / CREAT RATIO: 12 (ref 9–23)
BUN: 8 mg/dL (ref 6–24)
Basophils Absolute: 0 10*3/uL (ref 0.0–0.2)
Bilirubin Total: 0.3 mg/dL (ref 0.0–1.2)
CALCIUM: 9.2 mg/dL (ref 8.7–10.2)
CHLORIDE: 101 mmol/L (ref 96–106)
CHOL/HDL RATIO: 5.7 ratio — AB (ref 0.0–4.4)
Cholesterol, Total: 229 mg/dL — ABNORMAL HIGH (ref 100–199)
Creatinine, Ser: 0.69 mg/dL (ref 0.57–1.00)
EOS (ABSOLUTE): 0.2 10*3/uL (ref 0.0–0.4)
ESTIMATED CHD RISK: 1.6 times avg. — AB (ref 0.0–1.0)
Eos: 2 %
Free Thyroxine Index: 1.9 (ref 1.2–4.9)
GFR calc Af Amer: 121 mL/min/{1.73_m2} (ref >59)
GFR calc non Af Amer: 105 mL/min/{1.73_m2} (ref >59)
GGT: 40 IU/L (ref 0–60)
Globulin, Total: 2.3 g/dL (ref 1.5–4.5)
Glucose: 99 mg/dL (ref 65–99)
HDL: 40 mg/dL (ref >39)
HEMATOCRIT: 43.7 % (ref 34.0–46.6)
Hemoglobin: 14.5 g/dL (ref 11.1–15.9)
IMMATURE GRANS (ABS): 0 10*3/uL (ref 0.0–0.1)
Immature Granulocytes: 0 %
Iron: 106 ug/dL (ref 27–159)
LDH: 145 IU/L (ref 119–226)
LDL CALC: 157 mg/dL — AB (ref 0–99)
LYMPHS: 28 %
Lymphocytes Absolute: 2.1 10*3/uL (ref 0.7–3.1)
MCH: 29.8 pg (ref 26.6–33.0)
MCHC: 33.2 g/dL (ref 31.5–35.7)
MCV: 90 fL (ref 79–97)
MONOS ABS: 0.6 10*3/uL (ref 0.1–0.9)
Monocytes: 8 %
NEUTROS ABS: 4.7 10*3/uL (ref 1.4–7.0)
Neutrophils: 62 %
Phosphorus: 2.5 mg/dL (ref 2.5–4.5)
Platelets: 294 10*3/uL (ref 150–379)
Potassium: 4.7 mmol/L (ref 3.5–5.2)
RBC: 4.86 x10E6/uL (ref 3.77–5.28)
RDW: 13.2 % (ref 12.3–15.4)
SODIUM: 138 mmol/L (ref 134–144)
T3 Uptake Ratio: 24 % (ref 24–39)
T4, Total: 8.1 ug/dL (ref 4.5–12.0)
TSH: 0.833 u[IU]/mL (ref 0.450–4.500)
Total Protein: 6.7 g/dL (ref 6.0–8.5)
Triglycerides: 159 mg/dL — ABNORMAL HIGH (ref 0–149)
Uric Acid: 4.5 mg/dL (ref 2.5–7.1)
VLDL Cholesterol Cal: 32 mg/dL (ref 5–40)
WBC: 7.6 10*3/uL (ref 3.4–10.8)

## 2017-11-02 LAB — VITAMIN D 25 HYDROXY (VIT D DEFICIENCY, FRACTURES): VIT D 25 HYDROXY: 35.3 ng/mL (ref 30.0–100.0)

## 2017-11-08 ENCOUNTER — Other Ambulatory Visit: Payer: Self-pay | Admitting: Emergency Medicine

## 2017-11-08 DIAGNOSIS — E559 Vitamin D deficiency, unspecified: Secondary | ICD-10-CM

## 2017-11-09 ENCOUNTER — Other Ambulatory Visit: Payer: Self-pay | Admitting: Family Medicine

## 2017-12-12 ENCOUNTER — Ambulatory Visit: Payer: Self-pay

## 2017-12-12 NOTE — Telephone Encounter (Signed)
Patient called in and says "I went to the podiatrist office this morning and my BP was 190/98. He told me to get in touch with my PCP to address this BP." I asked did she take her BP medication before the OV, she said "yes. I took it around 0700 this morning." I asked is compliant, she says "yes, I take it every day." I asked is she having any symptoms-blurred vision, numbness, tingling, headache, weakness, difficulty breathing; she said "no to all of them. I feel fine, but I do need to get this checked out." According to protocol, see PCP within 2 weeks, [facility], [facility]/17 at 1345 with Dr. Birdie SonsSonnenberg, care advice given, she verbalized understanding.  Reason for Disposition . [1] Systolic BP  >= 140 OR Diastolic >= 90 AND [2] taking BP medications  Answer Assessment - Initial Assessment Questions 1. BLOOD PRESSURE: "What is the blood pressure?" "Did you take at least two measurements 5 minutes apart?"     190/98 in the podiatrist office 2. ONSET: "When did you take your blood pressure?"     Around 1000-1030 3. HOW: "How did you obtain the blood pressure?" (e.g., visiting nurse, automatic home BP monitor)     Office BP monitor-manual 4. HISTORY: "Do you have a history of high blood pressure?"     Yes 5. MEDICATIONS: "Are you taking any medications for blood pressure?" "Have you missed any doses recently?"     Yes; no doses missed 6. OTHER SYMPTOMS: "Do you have any symptoms?" (e.g., headache, chest pain, blurred vision, difficulty breathing, weakness)     No 7. PREGNANCY: "Is there any chance you are pregnant?" "When was your last menstrual period?"     No  Protocols used: HIGH BLOOD PRESSURE-A-AH

## 2017-12-16 ENCOUNTER — Encounter: Payer: Self-pay | Admitting: Family Medicine

## 2017-12-16 ENCOUNTER — Other Ambulatory Visit: Payer: Self-pay

## 2017-12-16 ENCOUNTER — Ambulatory Visit: Payer: Managed Care, Other (non HMO) | Admitting: Family Medicine

## 2017-12-16 DIAGNOSIS — G8929 Other chronic pain: Secondary | ICD-10-CM

## 2017-12-16 DIAGNOSIS — M2550 Pain in unspecified joint: Secondary | ICD-10-CM

## 2017-12-16 DIAGNOSIS — M25572 Pain in left ankle and joints of left foot: Secondary | ICD-10-CM

## 2017-12-16 DIAGNOSIS — E559 Vitamin D deficiency, unspecified: Secondary | ICD-10-CM

## 2017-12-16 DIAGNOSIS — I10 Essential (primary) hypertension: Secondary | ICD-10-CM | POA: Diagnosis not present

## 2017-12-16 MED ORDER — NAPROXEN 500 MG PO TABS: 500.0000 mg | ORAL_TABLET | Freq: Two times a day (BID) | ORAL | 0 refills | Status: DC | PRN

## 2017-12-16 NOTE — Assessment & Plan Note (Signed)
Seen by podiatry.  Currently in a brace.  She has another week and this.  She notes it is improving some.  We will switch her to Naprosyn.  She will start this tomorrow and discontinue the meloxicam.  If not continuing to improve she will let us know and we will refer to a new provider at her request.

## 2017-12-16 NOTE — Assessment & Plan Note (Signed)
Mildly elevated.  She has been taking her medication.  Potentially could be related to pain from her left ankle.  She will monitor for the next 2 weeks and return for BP check with nursing.

## 2017-12-16 NOTE — Progress Notes (Signed)
Tommi Rumps, MD Phone: [phone]  ALYSSA ROTONDO is a 47 y.o. female who presents today for follow-up.  Hypertension: Similar to today over the past couple of weeks though prior to that well-controlled.  Potentially elevated at the podiatrists office related to irritation and discomfort in her left foot and ankle.  She has been taking amlodipine.  No chest pain, shortness of breath, or edema.  Vitamin D deficiency: Most recently normal.  She has been on 50,000 units.  She reports scattered joint aches particularly in her elbows and fingers.  These have been going on for some time.  No joint swelling or tenderness.  Does have a strong family history of rheumatoid arthritis.  She has been following with podiatry for left foot and ankle pain.  They placed her in a brace and put tape around her ankle.  She has had slight swelling in the ankle and foot related to tendinitis.  She is on meloxicam now though is interested in changing this.  She wonders about prednisone.  Did have a meniscal surgery last year and notes her foot and ankle issues started after that.  She does report some generalized tiredness that has been going on since the surgery as well.  She just feels like she could sleep though notes no daytime napping.  Notes she sleeps well at night.  No snoring or apneic episodes.  Recent lab panel is unremarkable for cause of tiredness.  Potentially could be deconditioning.  Social History   Tobacco Use  Smoking Status Never Smoker  Smokeless Tobacco Never Used     ROS see history of present illness  Objective  Physical Exam Vitals:   12/16/17 1353  BP: (!) 144/80  Pulse: 90  Temp: 98.7 F (37.1 C)  SpO2: 96%    BP Readings from Last 3 Encounters:  12/16/17 (!) 144/80  10/28/17 140/80  09/22/17 (!) 160/84   Wt Readings from Last 3 Encounters:  12/16/17 194 lb 6.4 oz (88.2 kg)  09/08/17 190 lb (86.2 kg)  08/31/17 190 lb (86.2 kg)    Physical Exam    Constitutional: No distress.  Cardiovascular: Normal rate, regular rhythm and normal heart sounds.  Pulmonary/Chest: Effort normal and breath sounds normal.  Musculoskeletal: She exhibits no edema.  No tenderness of the left ankle or foot, minimal swelling in the left ankle, 2+ DP pulse in the left foot  Neurological: She is alert. Gait normal.  Skin: Skin is warm and dry. She is not diaphoretic.     Assessment/Plan: Please see individual problem list.  Essential hypertension Mildly elevated.  She has been taking her medication.  Potentially could be related to pain from her left ankle.  She will monitor for the next 2 weeks and return for BP check with nursing.  Arthralgia Occasional scattered arthralgias.  She does have a family history of rheumatoid arthritis.  We will check an ANA, ESR, CCP, and rheumatoid factor.  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  Her tiredness could potentially be related to a rheumatologic issue if she were to have one.  Does not seem to be related to sleep apnea.  Could be deconditioning.  Recent executive panel unremarkable for cause.  She will monitor that aspect and will determine next step of management based off of these other lab tests.  Vitamin D deficiency She will transition to 1000 international units of vitamin D over-the-counter  Left ankle pain Seen by podiatry.  Currently in  a brace.  She has another week and this.  She notes it is improving some.  We will switch her to Naprosyn.  She will start this tomorrow and discontinue the meloxicam.  If not continuing to improve she will let us know and we will refer to a new provider at her request.   No orders of the defined types were placed in this encounter.   Meds ordered this encounter  Medications  . naproxen (NAPROSYN) 500 MG tablet    Sig: Take 1 tablet (500 mg total) by mouth 2 (two) times daily as needed for moderate pain. Take with food.     Dispense:  30 tablet    Refill:  0     Tommi Rumps, MD [place]

## 2017-12-16 NOTE — Patient Instructions (Signed)
Nice to see you. Please monitor your blood pressure daily at home weeks for recheck. Please continue the brace for your left foot and ankle.  We will switch you to naproxen and see if that is beneficial.  You can discontinue the meloxicam and take the naproxen starting when your next dose would have been. Please start on vitamin D 1000 international units daily over-the-counter. Please get the lab work that we discussed for your joint aches.

## 2017-12-16 NOTE — Assessment & Plan Note (Signed)
She will transition to 1000 international units of vitamin D over-the-counter

## 2017-12-16 NOTE — Assessment & Plan Note (Signed)
Occasional scattered arthralgias.  She does have a family history of rheumatoid arthritis.  We will check an ANA, ESR, CCP, and rheumatoid factor.  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  Her tiredness could potentially be related to a rheumatologic issue if she were to have one.  Does not seem to be related to sleep apnea.  Could be deconditioning.  Recent executive panel unremarkable for cause.  She will monitor that aspect and will determine next step of management based off of these other lab tests.

## 2017-12-21 ENCOUNTER — Other Ambulatory Visit: Payer: Self-pay

## 2017-12-21 DIAGNOSIS — M2550 Pain in unspecified joint: Secondary | ICD-10-CM

## 2017-12-23 LAB — ANA: Anti Nuclear Antibody(ANA): NEGATIVE

## 2017-12-23 LAB — RHEUMATOID FACTOR

## 2017-12-23 LAB — CYCLIC CITRUL PEPTIDE ANTIBODY, IGG/IGA: Cyclic Citrullin Peptide Ab: 4 units (ref 0–19)

## 2017-12-23 LAB — SEDIMENTATION RATE: Sed Rate: 20 mm/hr (ref 0–32)

## 2017-12-27 ENCOUNTER — Other Ambulatory Visit: Payer: Self-pay | Admitting: Family Medicine

## 2017-12-27 DIAGNOSIS — Z5181 Encounter for therapeutic drug level monitoring: Secondary | ICD-10-CM

## 2017-12-28 ENCOUNTER — Other Ambulatory Visit: Payer: Self-pay | Admitting: Family Medicine

## 2017-12-29 MED ORDER — LEVONORGEST-ETH ESTRAD 91-DAY 0.15-0.03 &0.01 MG PO TABS: 1.0000 | ORAL_TABLET | Freq: Every day | ORAL | 0 refills | Status: DC

## 2017-12-29 NOTE — Telephone Encounter (Signed)
Last OV 12/16/17 last filled 12/16/17 30 0rf

## 2017-12-30 NOTE — Telephone Encounter (Signed)
Please see how she is doing with her joint aches.  Please see how often she is taking this medication.  If she continues to require this medication we will need to have her come in and check her kidney function.  I will determine appropriateness of refill after we speak with her.  Thanks.

## 2018-01-03 ENCOUNTER — Telehealth: Payer: Self-pay | Admitting: Family Medicine

## 2018-01-03 NOTE — Telephone Encounter (Signed)
Left message to return call regarding message below on naproxen refill, ok for pec to speak to patient  Please see how she is doing with her joint aches.  Please see how often she is taking this medication.  If she continues to require this medication we will need to have her come in and check her kidney function.  I will determine appropriateness of refill after we speak with her.  Thanks.

## 2018-01-03 NOTE — Telephone Encounter (Signed)
Copied from CRM [phone]. Topic: Quick Communication - Office Called Patient >> Jan 03, 2018  4:08 PM Inetta FermoHendricks, Jessica S, CMA wrote: Left message to return call regarding message below on naproxen refill, ok for pec to speak to patient  Please see how she is doing with her joint aches.  Please see how often she is taking this medication.  If she continues to require this medication we will need to have her come in and check her kidney function.  I will determine appropriateness of refill after we speak with her.  Thanks.   Update: patient is calling and states that the medicine is helping her joint ache. Also states she is taking 2 times a day one in the morning and one in the evening and is working tylenol in between. Please advise. She would like a refill.

## 2018-01-04 ENCOUNTER — Telehealth: Payer: Self-pay | Admitting: Family Medicine

## 2018-01-04 ENCOUNTER — Ambulatory Visit: Payer: Self-pay

## 2018-01-04 NOTE — Telephone Encounter (Signed)
Noted sent to provider under refill request

## 2018-01-04 NOTE — Telephone Encounter (Signed)
Per [facility]  Update: patient is calling and states that the medicine is helping her joint ache. Also states she is taking 2 times a day one in the morning and one in the evening and is working tylenol in between. Please advise. She would like a refill.

## 2018-01-07 MED ORDER — NAPROXEN 500 MG PO TABS: 500.0000 mg | ORAL_TABLET | Freq: Two times a day (BID) | ORAL | 0 refills | Status: DC | PRN

## 2018-01-07 NOTE — Telephone Encounter (Signed)
Short-term refill will be sent to pharmacy.  She needs to have her renal function checked since she has been taking this so frequently.  Given persistent joint aches I would like to refer her to a rheumatologist.  Please see if she is willing to do this.  Thanks.

## 2018-01-11 NOTE — Telephone Encounter (Signed)
Left message to return call regarding rx refill, ok for pec to speak to patient and schedule non fasting lab appointment

## 2018-01-11 NOTE — Telephone Encounter (Signed)
Pt states she is no longer taking naproxen, her orthopedic Dr took her off the medication.

## 2018-01-17 ENCOUNTER — Ambulatory Visit: Payer: Managed Care, Other (non HMO) | Admitting: Family Medicine

## 2018-01-17 ENCOUNTER — Encounter: Payer: Self-pay | Admitting: Family Medicine

## 2018-01-17 DIAGNOSIS — I10 Essential (primary) hypertension: Secondary | ICD-10-CM | POA: Diagnosis not present

## 2018-01-17 DIAGNOSIS — B349 Viral infection, unspecified: Secondary | ICD-10-CM | POA: Diagnosis not present

## 2018-01-17 MED ORDER — FLUTICASONE PROPIONATE 50 MCG/ACT NA SUSP: 2.0000 | Freq: Every day | NASAL | 6 refills | Status: DC

## 2018-01-17 NOTE — Patient Instructions (Signed)
Nice to see you. You likely have a viral illness.  Please try the Flonase that I sent to your pharmacy.  You can continue Zyrtec.  You can also try Mucinex over-the-counter. Please try to stay well-hydrated. If you develop abdominal pain, shortness of breath, blood in your stool, nor any new or changing symptoms please be reevaluated.  If you are not improving in the next week please follow-up.

## 2018-01-17 NOTE — Assessment & Plan Note (Signed)
BP elevated today though has been well controlled at home.  She will continue her current medication and monitor her blood pressure.  If not improving as she starts to feel better from her current illness she will contact us.

## 2018-01-17 NOTE — Telephone Encounter (Signed)
Patient is coming in for ov today  

## 2018-01-17 NOTE — Assessment & Plan Note (Signed)
Symptoms most consistent with viral illness.  Discussed natural course of illness could be 5-7 days before improving.  She has a benign exam.  Discussed continued Zyrtec.  She can add Flonase and Mucinex.  Discussed staying well-hydrated.  Given return precautions.

## 2018-01-17 NOTE — Progress Notes (Signed)
  Alexandria AlarEric [name] Wurzel, MD Phone: [phone]  Alexandria SierrasDebbie J [name] is a 47 y.o. female who presents today for same day visit.   Patient notes symptoms started today.  Notes sinus congestion and pressure.  Notes her right ear has some ringing in it as well as some discomfort.  Blowing some bloody mucus out of her nose.  Has postnasal drip.  No fevers though possibly felt cold this morning.  She did have some diarrhea this morning as well though that has eased off.  She had a little abdominal discomfort prior to the diarrhea though that went away with the diarrhea.  Some nausea.  No vomiting.  She is urinating well.  Stay well-hydrated.  Blood pressures been running 138/78.  No chest pain or shortness of breath.  LMP 2 weeks ago.  Social History   Tobacco Use  Smoking Status Never Smoker  Smokeless Tobacco Never Used     ROS see history of present illness  Objective  Physical Exam Vitals:   01/17/18 1459 01/17/18 1522  BP: (!) 180/70 (!) 162/90  Pulse: 89   Temp: 97.9 F (36.6 C)   SpO2: 98%     BP Readings from Last 3 Encounters:  01/17/18 (!) 162/90  12/16/17 (!) 144/80  10/28/17 140/80   Wt Readings from Last 3 Encounters:  01/17/18 192 lb 6.4 oz (87.3 kg)  12/16/17 194 lb 6.4 oz (88.2 kg)  09/08/17 190 lb (86.2 kg)    Physical Exam  Constitutional: No distress.  HENT:  Head: Normocephalic and atraumatic.  Right Ear: Tympanic membrane, external ear and ear canal normal.  Left Ear: Tympanic membrane, external ear and ear canal normal.  Mouth/Throat: Oropharynx is clear and moist.  Neck: Neck supple.  Cardiovascular: Normal rate, regular rhythm and normal heart sounds.  Pulmonary/Chest: Effort normal and breath sounds normal.  Abdominal: Soft. Bowel sounds are normal. She exhibits no distension. There is no tenderness. There is no guarding.  Musculoskeletal: She exhibits no edema.  Lymphadenopathy:    She has no cervical adenopathy.  Neurological: She is alert.  Skin:  Skin is warm and dry. She is not diaphoretic.     Assessment/Plan: Please see individual problem list.  Viral illness Symptoms most consistent with viral illness.  Discussed natural course of illness could be 5-7 days before improving.  She has a benign exam.  Discussed continued Zyrtec.  She can add Flonase and Mucinex.  Discussed staying well-hydrated.  Given return precautions.  Essential hypertension BP elevated today though has been well controlled at home.  She will continue her current medication and monitor her blood pressure.  If not improving as she starts to feel better from her current illness she will contact us.   No orders of the defined types were placed in this encounter.   Meds ordered this encounter  Medications  . fluticasone (FLONASE) 50 MCG/ACT nasal spray    Sig: Place 2 sprays into both nostrils daily.    Dispense:  16 g    Refill:  6     Alexandria AlarEric Alexandria Teo, MD [facility]

## 2018-02-13 ENCOUNTER — Encounter: Payer: Self-pay | Admitting: Family Medicine

## 2018-02-13 ENCOUNTER — Ambulatory Visit: Payer: Self-pay | Admitting: Family Medicine

## 2018-02-13 VITALS — BP 146/86 | HR 93 | Temp 98.2°F | Resp 20

## 2018-02-13 DIAGNOSIS — J019 Acute sinusitis, unspecified: Secondary | ICD-10-CM

## 2018-02-13 DIAGNOSIS — R059 Cough, unspecified: Secondary | ICD-10-CM

## 2018-02-13 DIAGNOSIS — R05 Cough: Secondary | ICD-10-CM

## 2018-02-13 MED ORDER — BENZONATATE 200 MG PO CAPS: 200.0000 mg | ORAL_CAPSULE | Freq: Every evening | ORAL | 0 refills | Status: DC | PRN

## 2018-02-13 MED ORDER — DOXYCYCLINE HYCLATE 100 MG PO TABS: 100.0000 mg | ORAL_TABLET | Freq: Two times a day (BID) | ORAL | 0 refills | Status: AC

## 2018-02-13 NOTE — Progress Notes (Addendum)
Subjective: facial pressure      Alexandria [name] is a 47 y.o. female who presents for evaluation of severe facial pressure overlying her ethmoid sinuses, purulent nasal congestion, nonproductive cough, chills, bilateral ear pressure, headache, and sore throat for 3 days.  Patient reports symptoms seem to be getting worse and she had chills last night.  Denies any known fever.  Patient has a history of allergic rhinitis, which she takes Zyrtec and Flonase for.  Patient has a history of migraines but reports this pain is different. Treatment to date: Benadryl and Alka-Seltzer plus.  Denies rash, nausea, vomiting, diarrhea, SOB, wheezing, chest or back pain, difficulty swallowing, confusion, blurred vision, body aches, fatigue, or fever. History of smoking, asthma, COPD: Negative. History of recurrent sinus and/or lung infections: Negative. Medical history: Hypertension, migraines, and allergic rhinitis. Antibiotic use in the last 3 months: Negative.   Review of Systems Pertinent items noted in HPI and remainder of comprehensive ROS otherwise negative.     Objective:   Physical Exam General: Awake, alert, and oriented. No acute distress. Well developed, hydrated and nourished. Appears stated age. Nontoxic appearance.  HEENT:  PND noted.  Mild erythema to posterior oropharynx.  No edema or exudates of pharynx or tonsils. No erythema or bulging of TM.  Air/fluid level to right TM.  Otherwise normal right TM.  Left TM normal.  Mild erythema/edema to nasal mucosa.  Bilateral ethmoid sinus tenderness.  Remainder of sinuses nontender. Supple neck without adenopathy. Cardiac: Heart rate and rhythm are normal. No murmurs, gallops, or rubs are auscultated. S1 and S2 are heard and are of normal intensity.  Respiratory: No signs of respiratory distress. Lungs clear. No tachypnea. Able to speak in full sentences without dyspnea. Nonlabored respirations.  Skin: Skin is warm, dry and intact. Appropriate color for  ethnicity. No cyanosis noted.   Assessment:    sinusitis   Plan:    Discussed the diagnosis and treatment of sinusitis. Suggested symptomatic OTC remedies. Nasal saline spray for congestion.   Advised patient which OTC treatments not to take in order to avoid interactions. Discussed elevated blood pressure today and normal values.  Patient regularly checks her blood pressure at home and reports that it is usually below 140/90.  Advised patient to monitor blood pressure daily and report abnormal values to her primary care provider. Prescribed doxycycline due to severe sinus pressure.  Patient has taken this before and tolerated it well.  Advised patient that doxycycline interacts with her birth control and that a back-up form of contraception will be necessary for the duration of treatment with doxycycline.   Prescribed Tessalon Perles to use as needed at night for cough. Discussed side/adverse effects of all medications prescribed. Discussed red flag symptoms and circumstances with which to seek medical care.   New Prescriptions   BENZONATATE (TESSALON) 200 MG CAPSULE    Take 1 capsule (200 mg total) by mouth at bedtime as needed for cough.   DOXYCYCLINE (VIBRA-TABS) 100 MG TABLET    Take 1 tablet (100 mg total) by mouth 2 (two) times daily for 7 days.   02/14/2018: Patient called to report that the doxycycline made her feel sick and that she could not tolerate it.  Patient requesting azithromycin.  Advised patient that this is no longer the first-line option but patient insists because this has "worked for her before".  Sent azithromycin to the patient's pharmacy as an alternative to doxycycline.

## 2018-02-14 ENCOUNTER — Encounter: Payer: Self-pay | Admitting: Family Medicine

## 2018-02-14 MED ORDER — AZITHROMYCIN 250 MG PO TABS: ORAL_TABLET | ORAL | 0 refills | Status: DC

## 2018-02-14 NOTE — Addendum Note (Signed)
Addended by: Frances Maywood on: 02/14/2018 01:19 PM   Modules accepted: Orders

## 2018-03-09 ENCOUNTER — Telehealth: Payer: Self-pay

## 2018-03-09 NOTE — Telephone Encounter (Signed)
Pt called requesting Cipro for "burning sensation during urination from antibiotic prescribed at last visit." (02/13/18) Spoke with provider who requested she make an office visit to better evaluate Alexandria [name]'s symptoms to give the most appropriate care possible. Alexandria [name] denied OV request and hung up.

## 2018-03-20 ENCOUNTER — Ambulatory Visit: Payer: Self-pay | Admitting: Family Medicine

## 2018-03-30 ENCOUNTER — Ambulatory Visit: Payer: Self-pay | Admitting: Family Medicine

## 2018-03-30 VITALS — BP 140/84 | HR 92 | Resp 20 | Ht 63.0 in | Wt 193.0 lb

## 2018-03-30 DIAGNOSIS — Z0000 Encounter for general adult medical examination without abnormal findings: Secondary | ICD-10-CM

## 2018-03-30 NOTE — Progress Notes (Signed)
Subjective: Annual biometrics screening physical exam Patient presents for her annual biometric screening physical exam only.  Patient completed biometric labs with her primary care provider's office. Patient reports eating a healthy, well-rounded diet and getting regular physical activity.  Patient regularly sees her primary care provider. PCP: Dr. Birdie SonsSonnenberg. Patient works for [employer]. Patient denies any other issues or concerns.   Review of Systems Unremarkable  Objective  Physical Exam General: Awake, alert and oriented. No acute distress. Well developed, hydrated and nourished. Appears stated age.  HEENT: Supple neck without adenopathy. Sclera is non-icteric. The ear canal is clear without discharge. The tympanic membrane is normal in appearance with normal landmarks and cone of light. Nasal mucosa is pink and moist. Oral mucosa is pink and moist. The pharynx is normal in appearance without tonsillar swelling or exudates.  Skin: Skin in warm, dry and intact without rashes or lesions. Appropriate color for ethnicity. Cardiac: Heart rate and rhythm are normal. No murmurs, gallops, or rubs are auscultated.  Respiratory: The chest wall is symmetric and without deformity. No signs of respiratory distress. Lung sounds are clear in all lobes bilaterally without rales, ronchi, or wheezes.  Neurological: The patient is awake, alert and oriented to person, place, and time with normal speech.  Memory is normal and thought processes intact. No gait abnormalities are appreciated.  Psychiatric: Appropriate mood and affect.   Assessment Annual biometrics screening physical exam  Plan  Encouraged routine visits with primary care provider.  Patient's blood pressure is 140/84 today.  Discussed normal values.  Advised patient to monitor this regularly and report abnormal values to her primary care provider. Encouraged patient to get regular exercise and eat a healthy, well-rounded diet.

## 2018-04-17 ENCOUNTER — Other Ambulatory Visit: Payer: Self-pay | Admitting: Family Medicine

## 2018-04-18 MED ORDER — LEVONORGEST-ETH ESTRAD 91-DAY 0.15-0.03 &0.01 MG PO TABS: 1.0000 | ORAL_TABLET | Freq: Every day | ORAL | 0 refills | Status: DC

## 2018-05-02 ENCOUNTER — Ambulatory Visit: Payer: Self-pay | Admitting: Physician Assistant

## 2018-05-02 VITALS — BP 138/82 | HR 67 | Temp 98.3°F | Resp 14 | Ht 63.0 in | Wt 190.0 lb

## 2018-05-02 DIAGNOSIS — J0100 Acute maxillary sinusitis, unspecified: Secondary | ICD-10-CM

## 2018-05-02 MED ORDER — AZITHROMYCIN 250 MG PO TABS: ORAL_TABLET | ORAL | 0 refills | Status: DC

## 2018-05-02 MED ORDER — PREDNISONE 50 MG PO TABS: 50.0000 mg | ORAL_TABLET | Freq: Every day | ORAL | 0 refills | Status: AC

## 2018-05-02 NOTE — Progress Notes (Signed)
S: 47 year old female presents to [facility] with one week history of sinus symptoms. Began with bilateral maxillary sinus pressure. Gradually worsening. Associated nasal congestion, postnasal drip, and bilateral ear fullness/pressure. Today developed sore throat and cough. Cough intermittent productive. Has taken OTC Tylenol for pain relief. Patient regularly uses Flonase and Zyrtec to prevent frequent episodes of sinusitis. Sinus symptoms triggered migraine yesterday, lessened but did not resolve with Maxalt. Patient denies fever, chills, dizziness/lightheadedness, ear discharge/drainage, chest pain, SOB, wheezing. Patient with no asthma history. Patient requesting Z-pak; works best for her. Patient with sinus infection treated with Z-pak on 02/13/18, 10/28/17, 11/16/16, 06/28/16, and 05/25/16.  O: Patient sitting comfortably on examination table. In no acute distress. Vital signs stable. Face normal to inspection. Mild tenderness with palpation/percussion over bilateral maxillary sinuses. Nasal mucosa with mild bogginess, scant clear rhinorrhea. Bilateral TMs with serous effusion. Left TM with mild injection. Bilateral anterior cervical lymphadenopathy, worse on left side, tender to palpation. Throat with no erythema or exudate. No visible postnasal drip. Heart regular rate and rhythm. Lungs clear to auscultation bilaterally. No wheezing. Cough elicited with deep inspiration.  A: Acute sinusitis  P: Patient prescribed Z-pak and 3-day course of Prednisone 50mg . Advised patient to continue to increase fluids, rest, continue OTC Zyrtec, may discontinue Flonase for few days and use saline nasal spray. Patient given work excuse note for today.

## 2018-07-18 ENCOUNTER — Other Ambulatory Visit: Payer: Self-pay | Admitting: Family Medicine

## 2018-07-19 ENCOUNTER — Other Ambulatory Visit: Payer: Self-pay | Admitting: Family Medicine

## 2018-07-20 ENCOUNTER — Other Ambulatory Visit: Payer: Self-pay | Admitting: Family Medicine

## 2018-07-20 MED ORDER — LEVONORGEST-ETH ESTRAD 91-DAY 0.15-0.03 &0.01 MG PO TABS: 1.0000 | ORAL_TABLET | Freq: Every day | ORAL | 0 refills | Status: DC

## 2018-07-24 MED ORDER — LEVONORGEST-ETH ESTRAD 91-DAY 0.15-0.03 &0.01 MG PO TABS: 1.0000 | ORAL_TABLET | Freq: Every day | ORAL | 0 refills | Status: DC

## 2018-08-01 ENCOUNTER — Ambulatory Visit: Payer: Self-pay | Admitting: Emergency Medicine

## 2018-08-01 VITALS — BP 140/80 | HR 85 | Temp 98.8°F | Resp 14

## 2018-08-01 DIAGNOSIS — R059 Cough, unspecified: Secondary | ICD-10-CM

## 2018-08-01 DIAGNOSIS — R42 Dizziness and giddiness: Secondary | ICD-10-CM

## 2018-08-01 DIAGNOSIS — J301 Allergic rhinitis due to pollen: Secondary | ICD-10-CM

## 2018-08-01 DIAGNOSIS — R05 Cough: Secondary | ICD-10-CM

## 2018-08-01 MED ORDER — BENZONATATE 100 MG PO CAPS: 100.0000 mg | ORAL_CAPSULE | Freq: Three times a day (TID) | ORAL | 0 refills | Status: DC | PRN

## 2018-08-01 MED ORDER — MECLIZINE HCL 25 MG PO TABS: ORAL_TABLET | ORAL | 0 refills | Status: DC

## 2018-08-01 NOTE — Progress Notes (Addendum)
Subjective. Patient presents with illness the last 3 to 4 days.  She has had significant pressure in her sinus area.  She feels like she has postnasal drip.  She has had 2 episodes of emesis and 2 episodes of loose stools yesterday.  She has not had a fever.  She has complained of some spinning sensation dizziness and has had a history of inner ear problems.  She has a history of intermittent allergies but has not been taking her Zyrtec recently.  She has not had a flu shot yet this year. Objective. Alert and cooperative not ill-appearing.  Blood pressure elevated at 148/84 and on repeat 160/98. Neck supple without adenopathy. TMs clear. Nose reveals dryness of the anterior nares. Chest clear to auscultation. Heart regular rate and rhythm. Abdomen soft without tenderness. Neurological reveals pupils equal reactive to light with no nystagmus on lateral gaze.. Assessment. I am concerned about the patient's blood pressure.  She will follow this up with her primary care physician.  We will treat with Zyrtec 1 a day along with Tessalon Perles as needed for cough.  She was also given a prescription for Antivert 25 mg 1/2-1 3 times a day as needed for dizziness. Plan Follow-up blood pressure with primary care physician. Antivert 25 mg 1/2-1 3 times a day. Tessalon Perles as needed for cough. Out of work 2 days. Zyrtec 1 a day I advised the patient she should discuss with her primary care physician possibly coming off of birth control pills due to her history of migraines/hypertension/and her age.

## 2018-08-01 NOTE — Patient Instructions (Addendum)
Take Zyrtec 1 a day. You have a prescription for Antivert for nausea and dizziness. Be sure you follow-up your blood  pressure with your primary care physician. Take Tessalon Perles as needed for cough. Patient advised to discuss alternative form of birth control and getting off birth control pills   Vertigo Vertigo means that you feel like you are moving when you are not. Vertigo can also make you feel like things around you are moving when they are not. This feeling can come and go at any time. Vertigo often goes away on its own. Follow these instructions at home:  Avoid making fast movements.  Avoid driving.  Avoid using heavy machinery.  Avoid doing any task or activity that might cause danger to you or other people if you would have a vertigo attack while you are doing it.  Sit down right away if you feel dizzy or have trouble with your balance.  Take over-the-counter and prescription medicines only as told by your doctor.  Follow instructions from your doctor about which positions or movements you should avoid.  Drink enough fluid to keep your pee (urine) clear or pale yellow.  Keep all follow-up visits as told by your doctor. This is important. Contact a doctor if:  Medicine does not help your vertigo.  You have a fever.  Your problems get worse or you have new symptoms.  Your family or friends see changes in your behavior.  You feel sick to your stomach (nauseous) or you throw up (vomit).  You have a "pins and needles" feeling or you are numb in part of your body. Get help right away if:  You have trouble moving or talking.  You are always dizzy.  You pass out (faint).  You get very bad headaches.  You feel weak or have trouble using your hands, arms, or legs.  You have changes in your hearing.  You have changes in your seeing (vision).  You get a stiff neck.  Bright light starts to bother you. This information is not intended to replace advice  given to you by your health care provider. Make sure you discuss any questions you have with your health care provider. Document Released: 07/06/2008 Document Revised: 03/04/2016 Document Reviewed: 01/20/2015 Elsevier Interactive Patient Education  [employer].

## 2018-08-03 ENCOUNTER — Telehealth: Payer: Self-pay

## 2018-08-03 ENCOUNTER — Telehealth: Payer: Self-pay | Admitting: Emergency Medicine

## 2018-08-03 MED ORDER — AZITHROMYCIN 250 MG PO TABS: ORAL_TABLET | ORAL | 0 refills | Status: DC

## 2018-08-03 NOTE — Telephone Encounter (Signed)
I called and discussed situation with patient.  She has a brownish nasal discharge associated with significant sinus pressure and aching in her teeth.  Her pressure was elevated previously so I did not put her on prednisone but did add a Z-Pak to cover for sinusitis.  This will be sent to the pharmacy.

## 2018-08-03 NOTE — Telephone Encounter (Signed)
I called and spoke with the patient.  She is developing a sinus infection.  I sent in a prescription for Zithromax.  Thank you.

## 2018-08-03 NOTE — Telephone Encounter (Signed)
Again advised patient discuss with her primary care physician the use of her birth control pills with her age and history of migraines.  She will also use birth control back-up while on antibiotics.

## 2018-08-04 NOTE — Addendum Note (Signed)
Addended by: Karen Kays on: 08/04/2018 02:17 PM   Modules accepted: Level of Service

## 2018-09-17 DIAGNOSIS — M170 Bilateral primary osteoarthritis of knee: Secondary | ICD-10-CM | POA: Insufficient documentation

## 2018-10-12 ENCOUNTER — Other Ambulatory Visit: Payer: Self-pay | Admitting: Family Medicine

## 2018-10-12 NOTE — Telephone Encounter (Signed)
She needs to follow-up in the office to discuss her blood pressure prior to this being refilled.

## 2018-10-13 ENCOUNTER — Other Ambulatory Visit: Payer: Self-pay | Admitting: Family Medicine

## 2018-10-13 MED ORDER — LEVONORGEST-ETH ESTRAD 91-DAY 0.15-0.03 &0.01 MG PO TABS: 1.0000 | ORAL_TABLET | Freq: Every day | ORAL | 0 refills | Status: DC

## 2018-10-13 NOTE — Telephone Encounter (Signed)
This should not have been refilled. I wanted her to be contacted and seen in the office to discuss this medication given her blood pressure and her history of migraines. Please call the pharmacy and see if she has picked this up. If she has not picked it up please cancel the prescription. If she has picked it up please contact the patient and advise that I would like to see her in the office to discuss this medication. It is not a good option for her birth control given the migraine history.

## 2018-10-13 NOTE — Telephone Encounter (Signed)
Prescription has been cancelled at [facility]

## 2018-10-16 ENCOUNTER — Telehealth: Payer: Self-pay

## 2018-10-16 NOTE — Telephone Encounter (Signed)
This should not have been refilled. I wanted her to be contacted and seen in the office to discuss this medication given her blood pressure and her history of migraines. Please call the pharmacy and see if she has picked this up. If she has not picked it up please cancel the prescription. If she has picked it up please contact the patient and advise that I would like to see her in the office to discuss this medication. It is not a good option for her birth control given the migraine history.    Called patient and left a VM to call our office back so we could schedule her for an appt with Sonnenberg.

## 2018-10-17 NOTE — Telephone Encounter (Signed)
Called pt and left a VM to call back. CRM created and sent to [facility] pool. 

## 2018-10-24 NOTE — Telephone Encounter (Signed)
Called patient and left a VM to call back UTR unable to reach letter mailed to patient.

## 2018-11-15 ENCOUNTER — Telehealth: Payer: Self-pay | Admitting: *Deleted

## 2018-11-15 NOTE — Telephone Encounter (Signed)
Copied from CRM [id number]. Topic: Appointment Scheduling - Scheduling Inquiry for Clinic >> Nov 15, 2018  4:06 PM [name], NT wrote: Reason for CRM: Pt would like to see if she can see someone this Thursday or Friday for a med refill of her BP medication. Advised pt Dr. Birdie Sons will be out of the office. She states she is out of medication. Please advise.

## 2018-11-16 NOTE — Telephone Encounter (Signed)
Patient can be scheduled with a NP Guse or Arnett for BP refill until can see regular provider. [facility] nurse can advise.

## 2018-11-17 ENCOUNTER — Ambulatory Visit: Payer: Managed Care, Other (non HMO) | Admitting: Family Medicine

## 2018-11-17 ENCOUNTER — Encounter: Payer: Self-pay | Admitting: Family Medicine

## 2018-11-17 VITALS — BP 158/90 | HR 93 | Temp 98.8°F | Resp 16 | Ht 63.0 in | Wt 188.0 lb

## 2018-11-17 DIAGNOSIS — J301 Allergic rhinitis due to pollen: Secondary | ICD-10-CM

## 2018-11-17 DIAGNOSIS — G43109 Migraine with aura, not intractable, without status migrainosus: Secondary | ICD-10-CM | POA: Diagnosis not present

## 2018-11-17 DIAGNOSIS — I10 Essential (primary) hypertension: Secondary | ICD-10-CM

## 2018-11-17 LAB — COMPREHENSIVE METABOLIC PANEL
ALT: 22 U/L (ref 0–35)
AST: 19 U/L (ref 0–37)
Albumin: 4.3 g/dL (ref 3.5–5.2)
Alkaline Phosphatase: 76 U/L (ref 39–117)
BUN: 11 mg/dL (ref 6–23)
CALCIUM: 9.2 mg/dL (ref 8.4–10.5)
CHLORIDE: 104 meq/L (ref 96–112)
CO2: 25 meq/L (ref 19–32)
CREATININE: 0.86 mg/dL (ref 0.40–1.20)
GFR: 70.49 mL/min (ref >60.00)
GLUCOSE: 91 mg/dL (ref 70–99)
Potassium: 4.5 mEq/L (ref 3.5–5.1)
SODIUM: 139 meq/L (ref 135–145)
Total Bilirubin: 0.4 mg/dL (ref 0.2–1.2)
Total Protein: 7 g/dL (ref 6.0–8.3)

## 2018-11-17 MED ORDER — CETIRIZINE HCL 10 MG PO TABS: 10.0000 mg | ORAL_TABLET | Freq: Every day | ORAL | 3 refills | Status: AC

## 2018-11-17 MED ORDER — AMLODIPINE BESYLATE 10 MG PO TABS: 10.0000 mg | ORAL_TABLET | Freq: Every day | ORAL | 3 refills | Status: DC

## 2018-11-17 NOTE — Progress Notes (Signed)
Subjective:    Patient ID: Alexandria [name], female    DOB: [birth year], 48 y.o.   MRN: [id number]  HPI   Patient presents to clinic for follow-up on her blood pressure.  Currently she has been taking amlodipine 5 mg daily.  She has been out of this medication for 3 days.  She has upcoming left knee replacement surgery at the end of February and wants to be sure her blood pressure is in good control so she can go through with surgery.  Patient states her blood pressures at home have been running on the higher end in the 130s to 140s over 80s 90s.  Denies any issues with chest pain, palpitations, lower extremity swelling.  Denies shortness of breath or wheezing.  Patient no longer takes her birth control.  She uses Maxalt or Imitrex on an as-needed basis for migraine control.  Patient states she has not had a migraine headache in over 1-1/2 months.  She uses her tramadol as needed for knee pain.  Patient also requests refill of her Zyrtec, uses this medication with good success to control allergy symptoms.  Patient Active Problem List   Diagnosis Date Noted  . Viral illness 01/17/2018  . Arthralgia 12/16/2017  . Vitamin D deficiency 12/16/2017  . Left ankle pain 12/16/2017  . Essential hypertension 05/25/2016  . Migraine 05/25/2016  . Allergic rhinitis 05/25/2016  . Oral contraceptive use 05/25/2016  . Osteoarthritis of right knee 05/25/2016   Social History   Tobacco Use  . Smoking status: Never Smoker  . Smokeless tobacco: Never Used  Substance Use Topics  . Alcohol use: Yes    Alcohol/week: 0.0 - 1.0 standard drinks   Review of Systems   Constitutional: Negative for chills, fatigue and fever.  HENT: Negative for congestion, ear pain, sinus pain and sore throat.   Eyes: Negative.   Respiratory: Negative for cough, shortness of breath and wheezing.   Cardiovascular: Negative for chest pain, palpitations and leg swelling.  Gastrointestinal: Negative for abdominal pain,  diarrhea, nausea and vomiting.  Genitourinary: Negative for dysuria, frequency and urgency.  Musculoskeletal: Negative for arthralgias and myalgias.  Skin: Negative for color change, pallor and rash.  Neurological: Negative for syncope, light-headedness and headaches.  Psychiatric/Behavioral: The patient is not nervous/anxious.       Objective:   Physical Exam  Constitutional: She appears well-developed and well-nourished. No distress.  HENT:  Head: Normocephalic and atraumatic.  Eyes: Pupils are equal, round, and reactive to light. EOM are normal. No scleral icterus.  Neck: Normal range of motion. Neck supple. No tracheal deviation present.  Cardiovascular: Normal rate, regular rhythm and normal heart sounds.  Lower extremity swelling.  No carotid bruit. Pulmonary/Chest: Effort normal and breath sounds normal. No respiratory distress. She has no wheezes. She has no rales.  Neurological: She is alert and oriented to person, place, and time.  Gait normal  Skin: Skin is warm and dry. No pallor.  Psychiatric: She has a normal mood and affect. Her behavior is normal. Thought content normal.   Nursing note and vitals reviewed.   BP Readings from Last 3 Encounters:  11/17/18 (!) 158/90  08/01/18 140/80  05/02/18 138/82   Today's Vitals   11/17/18 0858  BP: (!) 158/90  Pulse: 93  Resp: 16  Temp: 98.8 F (37.1 C)  TempSrc: Oral  SpO2: 97%  Weight: 188 lb (85.3 kg)  Height: 5\' 3"  (1.6 m)   Body mass index is 33.3 kg/m.  Assessment & Plan:    Hypertension- patient's blood pressures have been trending slightly higher over the past few visits and also have been running higher at home.  Will increase amlodipine to 10 mg/day.  She will monitor her blood pressure at home.  We will check complete metabolic panel in clinic today.  Discussed healthy diet including lean meats, lots of vegetables, low carb low sugar, lots of water.  Seasonal allergic rhinitis-Zyrtec refill  given  Migraine - migraines are stable.  Has not had a migraine in a month and a half.  When does have migraine Maxalt or Imitrex are effective.   Patient will follow-up here in 1 week for recheck of blood pressure with nurse.  If blood pressures look good, she will remain on the 10 mg amlodipine dose.

## 2018-11-21 NOTE — Discharge Instructions (Signed)
°  Instructions after Total Knee Replacement ° ° Raquon Milledge P. Matther Labell, Jr., M.D.    ° Dept. of Orthopaedics & Sports Medicine ° [facility] ° [address] ° [place], [place]  [address] ° Phone: [phone]   Fax: [phone] ° °  °DIET: °• Drink plenty of non-alcoholic fluids. °• Resume your normal diet. Include foods high in fiber. ° °ACTIVITY:  °• You may use crutches or a walker with weight-bearing as tolerated, unless instructed otherwise. °• You may be weaned off of the walker or crutches by your Physical Therapist.  °• Do NOT place pillows under the knee. Anything placed under the knee could limit your ability to straighten the knee.   °• Continue doing gentle exercises. Exercising will reduce the pain and swelling, increase motion, and prevent muscle weakness.   °• Please continue to use the TED compression stockings for 6 weeks. You may remove the stockings at night, but should reapply them in the morning. °• Do not drive or operate any equipment until instructed. ° °WOUND CARE:  °• Continue to use the PolarCare or ice packs periodically to reduce pain and swelling. °• You may bathe or shower after the staples are removed at the first office visit following surgery. ° °MEDICATIONS: °• You may resume your regular medications. °• Please take the pain medication as prescribed on the medication. °• Do not take pain medication on an empty stomach. °• You have been given a prescription for a blood thinner (Lovenox or Coumadin). Please take the medication as instructed. (NOTE: After completing a 2 week course of Lovenox, take one Enteric-coated aspirin once a day. This along with elevation will help reduce the possibility of phlebitis in your operated leg.) °• Do not drive or drink alcoholic beverages when taking pain medications. ° °CALL THE OFFICE FOR: °• Temperature above 101 degrees °• Excessive bleeding or drainage on the dressing. °• Excessive swelling, coldness, or paleness of the toes. °• Persistent  nausea and vomiting. ° °FOLLOW-UP:  °• You should have an appointment to return to the office in 10-14 days after surgery. °• Arrangements have been made for continuation of Physical Therapy (either home therapy or outpatient therapy). °  °

## 2018-11-22 ENCOUNTER — Other Ambulatory Visit: Payer: Self-pay

## 2018-11-22 ENCOUNTER — Encounter
Admission: RE | Admit: 2018-11-22 | Discharge: 2018-11-22 | Disposition: A | Discharge status: Home / Self Care | Payer: Managed Care, Other (non HMO) | Source: Ambulatory Visit | Attending: Orthopedic Surgery | Admitting: Orthopedic Surgery

## 2018-11-22 DIAGNOSIS — I10 Essential (primary) hypertension: Secondary | ICD-10-CM | POA: Diagnosis not present

## 2018-11-22 DIAGNOSIS — Z01818 Encounter for other preprocedural examination: Secondary | ICD-10-CM | POA: Diagnosis not present

## 2018-11-22 LAB — SURGICAL PCR SCREEN
MRSA, PCR: POSITIVE — AB
Staphylococcus aureus: POSITIVE — AB

## 2018-11-22 LAB — PROTIME-INR
INR: 0.93
Prothrombin Time: 12.4 seconds (ref 11.4–15.2)

## 2018-11-22 LAB — URINALYSIS, ROUTINE W REFLEX MICROSCOPIC
Bilirubin Urine: NEGATIVE
Glucose, UA: NEGATIVE mg/dL
Ketones, ur: NEGATIVE mg/dL
Nitrite: NEGATIVE
Protein, ur: NEGATIVE mg/dL
Specific Gravity, Urine: 1.014 (ref 1.005–1.030)
pH: 5 (ref 5.0–8.0)

## 2018-11-22 LAB — CBC
HCT: 42.9 % (ref 36.0–46.0)
Hemoglobin: 14 g/dL (ref 12.0–15.0)
MCH: 29.5 pg (ref 26.0–34.0)
MCHC: 32.6 g/dL (ref 30.0–36.0)
MCV: 90.3 fL (ref 80.0–100.0)
Platelets: 298 10*3/uL (ref 150–400)
RBC: 4.75 MIL/uL (ref 3.87–5.11)
RDW: 12.3 % (ref 11.5–15.5)
WBC: 8.3 10*3/uL (ref 4.0–10.5)
nRBC: 0 % (ref 0.0–0.2)

## 2018-11-22 LAB — APTT: aPTT: 26 seconds (ref 24–36)

## 2018-11-22 LAB — TYPE AND SCREEN
ABO/RH(D): O POS
ANTIBODY SCREEN: NEGATIVE

## 2018-11-22 LAB — SEDIMENTATION RATE: SED RATE: 7 mm/h (ref 0–20)

## 2018-11-22 LAB — C-REACTIVE PROTEIN: CRP: <0.8 mg/dL (ref <1.0)

## 2018-11-22 NOTE — Pre-Procedure Instructions (Signed)
FAXED UA/ MRSA/STAPH TO DR Ernest Pine

## 2018-11-22 NOTE — Patient Instructions (Signed)
  Your procedure is scheduled on: Wednesday December 06, 2018 Report to Same Day Surgery 2nd floor [facility] Entrance-take elevator on left to 2nd floor.  Check in with surgery information desk.) To find out your arrival time, call [phone] 1:00-3:00 PM on Tuesday December 05, 2018  Remember: Instructions that are not followed completely may result in serious medical risk, up to and including death, or upon the discretion of your surgeon and anesthesiologist your surgery may need to be rescheduled.    __x__ 1. Do not eat food (including mints, candies, chewing gum) after midnight the night before your procedure. You may drink clear liquids up to 2 hours before you are scheduled to arrive at the hospital for your procedure.  Do not drink anything within 2 hours of your scheduled arrival to the hospital.  Approved clear liquids:  --Water or Apple juice without pulp  --Clear carbohydrate beverage such as Gatorade or Powerade  --Black Coffee or Clear Tea (No milk, no creamers, do not add anything to the coffee or tea)    __x__ 2. No Alcohol for 24 hours before or after surgery.   __x__ 3. No Smoking or e-cigarettes for 24 hours before surgery.  Do not use any chewable tobacco products for at least 6 hours before surgery.   __x__ 4. Notify your doctor if there is any change in your medical condition (cold, fever, infections).   __x__ 5. On the morning of surgery brush your teeth with toothpaste and water.  You may rinse your mouth with mouthwash if you wish.  Do not swallow any toothpaste or mouthwash.  Please read over the following fact sheets that you were given:   [facility] Preparing for Surgery and/or MRSA Information    __x__ Use CHG Soap as directed on instruction sheet   Do not wear jewelry, make-up, hairpins, clips or nail polish on the day of surgery.  Do not wear lotions, powders, deodorant, or perfumes.   Do not shave below the face/neck 48 hours prior to  surgery.   Do not bring valuables to the hospital.    [facility] is not responsible for any belongings or valuables.               Contacts, dentures or bridgework may not be worn into surgery.  Leave your suitcase in the car. After surgery it may be brought to your room.  For patients admitted to the hospital, discharge time is determined by your treatment team.   __x__ Take these medicines on the morning of surgery with a SMALL SIP OF WATER:  1. Amlodipine/Norvasc  ____ Follow recommendations from Cardiologist, Pulmonologist or PCP regarding stopping Aspirin, Coumadin, Plavix, Eliquis, Effient, Pradaxa, and Pletal.  __x__ At least 7 days before surgery: Avoid Anti-inflammatories such as Advil, Ibuprofen, Motrin, Aleve, Naproxen, Naprosyn, BC/Goodies powders or aspirin products. You may continue to take Tylenol and Celebrex.   __x__ At least 7 days before surgery: Stop supplements until after surgery. You may continue to take Vitamin D, Vitamin B, and multivitamin.

## 2018-11-23 LAB — URINE CULTURE
Culture: NO GROWTH
SPECIAL REQUESTS: NORMAL

## 2018-11-24 LAB — IGE: IgE (Immunoglobulin E), Serum: 17 IU/mL (ref 6–495)

## 2018-11-28 NOTE — Telephone Encounter (Signed)
Patient was seen 11/17/18

## 2018-11-30 ENCOUNTER — Ambulatory Visit: Payer: Self-pay

## 2018-12-05 MED ORDER — TRANEXAMIC ACID-NACL 1000-0.7 MG/100ML-% IV SOLN
1000.0000 mg | INTRAVENOUS | Status: DC
  Filled 2018-12-05: qty 100

## 2018-12-05 MED ORDER — CLINDAMYCIN PHOSPHATE 900 MG/50ML IV SOLN: 900.0000 mg | INTRAVENOUS | Status: DC

## 2018-12-05 MED ORDER — VANCOMYCIN HCL IN DEXTROSE 1-5 GM/200ML-% IV SOLN
1000.0000 mg | Freq: Once | INTRAVENOUS | Status: AC
  Administered 2018-12-06: 1000 mg via INTRAVENOUS

## 2018-12-06 ENCOUNTER — Other Ambulatory Visit: Payer: Self-pay

## 2018-12-06 ENCOUNTER — Inpatient Hospital Stay: Payer: Managed Care, Other (non HMO) | Admitting: Anesthesiology

## 2018-12-06 ENCOUNTER — Encounter
Admission: RE | Disposition: A | Discharge status: Home Health | Payer: Self-pay | Source: Home / Self Care | Attending: Orthopedic Surgery

## 2018-12-06 ENCOUNTER — Inpatient Hospital Stay: Payer: Managed Care, Other (non HMO)

## 2018-12-06 ENCOUNTER — Inpatient Hospital Stay
Admission: RE | Admit: 2018-12-06 | Discharge: 2018-12-08 | DRG: 470 | Disposition: A | Discharge status: Home Health | Payer: Managed Care, Other (non HMO) | Attending: Orthopedic Surgery | Admitting: Orthopedic Surgery

## 2018-12-06 ENCOUNTER — Encounter: Payer: Self-pay | Admitting: Orthopedic Surgery

## 2018-12-06 DIAGNOSIS — I10 Essential (primary) hypertension: Secondary | ICD-10-CM | POA: Diagnosis present

## 2018-12-06 DIAGNOSIS — Z79899 Other long term (current) drug therapy: Secondary | ICD-10-CM | POA: Diagnosis not present

## 2018-12-06 DIAGNOSIS — M1712 Unilateral primary osteoarthritis, left knee: Principal | ICD-10-CM | POA: Diagnosis present

## 2018-12-06 DIAGNOSIS — Z881 Allergy status to other antibiotic agents status: Secondary | ICD-10-CM | POA: Diagnosis not present

## 2018-12-06 DIAGNOSIS — Z888 Allergy status to other drugs, medicaments and biological substances status: Secondary | ICD-10-CM

## 2018-12-06 DIAGNOSIS — Z880 Allergy status to penicillin: Secondary | ICD-10-CM | POA: Diagnosis not present

## 2018-12-06 DIAGNOSIS — Z8249 Family history of ischemic heart disease and other diseases of the circulatory system: Secondary | ICD-10-CM | POA: Diagnosis not present

## 2018-12-06 DIAGNOSIS — Z79891 Long term (current) use of opiate analgesic: Secondary | ICD-10-CM

## 2018-12-06 DIAGNOSIS — Z96659 Presence of unspecified artificial knee joint: Secondary | ICD-10-CM

## 2018-12-06 DIAGNOSIS — Z882 Allergy status to sulfonamides status: Secondary | ICD-10-CM | POA: Diagnosis not present

## 2018-12-06 HISTORY — PX: TOTAL KNEE ARTHROPLASTY: SHX125

## 2018-12-06 LAB — ABO/RH: ABO/RH(D): O POS

## 2018-12-06 LAB — POCT PREGNANCY, URINE: Preg Test, Ur: NEGATIVE

## 2018-12-06 SURGERY — ARTHROPLASTY, KNEE, TOTAL
Anesthesia: General | Site: Knee | Laterality: Left

## 2018-12-06 MED ORDER — ACETAMINOPHEN 10 MG/ML IV SOLN
INTRAVENOUS | Status: DC | PRN
  Administered 2018-12-06: 1000 mg via INTRAVENOUS

## 2018-12-06 MED ORDER — DEXAMETHASONE SODIUM PHOSPHATE 10 MG/ML IJ SOLN: 8.0000 mg | Freq: Once | INTRAMUSCULAR | Status: DC

## 2018-12-06 MED ORDER — LABETALOL HCL 5 MG/ML IV SOLN
INTRAVENOUS | Status: AC
  Filled 2018-12-06: qty 4

## 2018-12-06 MED ORDER — FERROUS SULFATE 325 (65 FE) MG PO TABS
325.0000 mg | ORAL_TABLET | Freq: Two times a day (BID) | ORAL | Status: DC
  Administered 2018-12-07 – 2018-12-08 (×3): 325 mg via ORAL
  Filled 2018-12-06 (×3): qty 1

## 2018-12-06 MED ORDER — TRANEXAMIC ACID-NACL 1000-0.7 MG/100ML-% IV SOLN
1000.0000 mg | Freq: Once | INTRAVENOUS | Status: AC
  Administered 2018-12-06: 1000 mg via INTRAVENOUS
  Filled 2018-12-06 (×2): qty 100

## 2018-12-06 MED ORDER — PHENOL 1.4 % MT LIQD
1.0000 | OROMUCOSAL | Status: DC | PRN
  Filled 2018-12-06: qty 177

## 2018-12-06 MED ORDER — GABAPENTIN 300 MG PO CAPS: 300.0000 mg | ORAL_CAPSULE | Freq: Once | ORAL | Status: DC

## 2018-12-06 MED ORDER — LIDOCAINE HCL (PF) 2 % IJ SOLN
INTRAMUSCULAR | Status: AC
  Filled 2018-12-06: qty 20

## 2018-12-06 MED ORDER — FENTANYL CITRATE (PF) 250 MCG/5ML IJ SOLN
INTRAMUSCULAR | Status: AC
  Filled 2018-12-06: qty 10

## 2018-12-06 MED ORDER — ONDANSETRON HCL 4 MG/2ML IJ SOLN
INTRAMUSCULAR | Status: AC
  Filled 2018-12-06: qty 2

## 2018-12-06 MED ORDER — AMLODIPINE BESYLATE 10 MG PO TABS
10.0000 mg | ORAL_TABLET | Freq: Every day | ORAL | Status: DC
  Administered 2018-12-07 – 2018-12-08 (×2): 10 mg via ORAL
  Filled 2018-12-06: qty 1
  Filled 2018-12-06: qty 2

## 2018-12-06 MED ORDER — KETAMINE HCL 50 MG/ML IJ SOLN
INTRAMUSCULAR | Status: DC | PRN
  Administered 2018-12-06: 100 mg via INTRAVENOUS

## 2018-12-06 MED ORDER — DIPHENHYDRAMINE HCL 12.5 MG/5ML PO ELIX: 12.5000 mg | ORAL_SOLUTION | ORAL | Status: DC | PRN

## 2018-12-06 MED ORDER — LABETALOL HCL 5 MG/ML IV SOLN
INTRAVENOUS | Status: DC | PRN
  Administered 2018-12-06: 20 mg via INTRAVENOUS

## 2018-12-06 MED ORDER — ONDANSETRON HCL 4 MG PO TABS: 4.0000 mg | ORAL_TABLET | Freq: Four times a day (QID) | ORAL | Status: DC | PRN

## 2018-12-06 MED ORDER — SUGAMMADEX SODIUM 200 MG/2ML IV SOLN
INTRAVENOUS | Status: AC
  Filled 2018-12-06: qty 2

## 2018-12-06 MED ORDER — PROPOFOL 10 MG/ML IV BOLUS
INTRAVENOUS | Status: DC | PRN
  Administered 2018-12-06: 150 mg via INTRAVENOUS

## 2018-12-06 MED ORDER — OXYCODONE HCL 5 MG PO TABS
5.0000 mg | ORAL_TABLET | ORAL | Status: DC | PRN
  Administered 2018-12-06 – 2018-12-08 (×5): 5 mg via ORAL
  Filled 2018-12-06 (×5): qty 1

## 2018-12-06 MED ORDER — ACETAMINOPHEN 10 MG/ML IV SOLN
1000.0000 mg | Freq: Four times a day (QID) | INTRAVENOUS | Status: AC
  Administered 2018-12-06 – 2018-12-07 (×4): 1000 mg via INTRAVENOUS
  Filled 2018-12-06 (×4): qty 100

## 2018-12-06 MED ORDER — ROCURONIUM BROMIDE 50 MG/5ML IV SOLN
INTRAVENOUS | Status: AC
  Filled 2018-12-06: qty 2

## 2018-12-06 MED ORDER — ONDANSETRON HCL 4 MG/2ML IJ SOLN: 4.0000 mg | Freq: Four times a day (QID) | INTRAMUSCULAR | Status: DC | PRN

## 2018-12-06 MED ORDER — ACETAMINOPHEN 325 MG PO TABS: 325.0000 mg | ORAL_TABLET | Freq: Four times a day (QID) | ORAL | Status: DC | PRN

## 2018-12-06 MED ORDER — FLEET ENEMA 7-19 GM/118ML RE ENEM: 1.0000 | ENEMA | Freq: Once | RECTAL | Status: DC | PRN

## 2018-12-06 MED ORDER — SODIUM CHLORIDE 0.9 % IV SOLN
INTRAVENOUS | Status: DC | PRN
  Administered 2018-12-06: 60 mL

## 2018-12-06 MED ORDER — ONDANSETRON HCL 4 MG/2ML IJ SOLN
INTRAMUSCULAR | Status: DC | PRN
  Administered 2018-12-06: 4 mg via INTRAVENOUS

## 2018-12-06 MED ORDER — CHLORHEXIDINE GLUCONATE 4 % EX LIQD: 60.0000 mL | Freq: Once | CUTANEOUS | Status: DC

## 2018-12-06 MED ORDER — PROPOFOL 10 MG/ML IV BOLUS
INTRAVENOUS | Status: AC
  Filled 2018-12-06: qty 20

## 2018-12-06 MED ORDER — CLINDAMYCIN PHOSPHATE 900 MG/50ML IV SOLN
INTRAVENOUS | Status: AC
  Filled 2018-12-06: qty 50

## 2018-12-06 MED ORDER — FLUTICASONE PROPIONATE 50 MCG/ACT NA SUSP
2.0000 | Freq: Every day | NASAL | Status: DC | PRN
  Filled 2018-12-06: qty 16

## 2018-12-06 MED ORDER — MAGNESIUM HYDROXIDE 400 MG/5ML PO SUSP
30.0000 mL | Freq: Every day | ORAL | Status: DC
  Administered 2018-12-07: 30 mL via ORAL
  Filled 2018-12-06 (×2): qty 30

## 2018-12-06 MED ORDER — LIDOCAINE HCL (CARDIAC) PF 100 MG/5ML IV SOSY
PREFILLED_SYRINGE | INTRAVENOUS | Status: DC | PRN
  Administered 2018-12-06: 100 mg via INTRAVENOUS

## 2018-12-06 MED ORDER — FAMOTIDINE 20 MG PO TABS
ORAL_TABLET | ORAL | Status: AC
  Administered 2018-12-06: 20 mg
  Filled 2018-12-06: qty 1

## 2018-12-06 MED ORDER — SODIUM CHLORIDE 0.9 % IV SOLN
INTRAVENOUS | Status: DC
  Administered 2018-12-06 (×2): via INTRAVENOUS

## 2018-12-06 MED ORDER — FLUOCINOLONE ACETONIDE 0.01 % OT OIL: 2.0000 [drp] | TOPICAL_OIL | Freq: Two times a day (BID) | OTIC | Status: DC | PRN

## 2018-12-06 MED ORDER — VANCOMYCIN HCL IN DEXTROSE 1-5 GM/200ML-% IV SOLN
INTRAVENOUS | Status: AC
  Filled 2018-12-06: qty 200

## 2018-12-06 MED ORDER — DEXAMETHASONE SODIUM PHOSPHATE 10 MG/ML IJ SOLN
INTRAMUSCULAR | Status: AC
  Administered 2018-12-06: 8 mg
  Filled 2018-12-06: qty 1

## 2018-12-06 MED ORDER — SODIUM CHLORIDE 0.9 % IV SOLN
INTRAVENOUS | Status: DC | PRN
  Administered 2018-12-06: 13:00:00

## 2018-12-06 MED ORDER — BISACODYL 10 MG RE SUPP: 10.0000 mg | Freq: Every day | RECTAL | Status: DC | PRN

## 2018-12-06 MED ORDER — KETAMINE HCL 50 MG/ML IJ SOLN
INTRAMUSCULAR | Status: AC
  Filled 2018-12-06: qty 10

## 2018-12-06 MED ORDER — ACETAMINOPHEN 10 MG/ML IV SOLN
INTRAVENOUS | Status: AC
  Filled 2018-12-06: qty 100

## 2018-12-06 MED ORDER — MENTHOL 3 MG MT LOZG
1.0000 | LOZENGE | OROMUCOSAL | Status: DC | PRN
  Filled 2018-12-06: qty 9

## 2018-12-06 MED ORDER — CELECOXIB 200 MG PO CAPS: 400.0000 mg | ORAL_CAPSULE | Freq: Once | ORAL | Status: DC

## 2018-12-06 MED ORDER — HYDROMORPHONE HCL 1 MG/ML IJ SOLN
0.5000 mg | INTRAMUSCULAR | Status: DC | PRN
  Administered 2018-12-06: 0.5 mg via INTRAVENOUS
  Filled 2018-12-06: qty 1

## 2018-12-06 MED ORDER — METOCLOPRAMIDE HCL 10 MG PO TABS
5.0000 mg | ORAL_TABLET | Freq: Three times a day (TID) | ORAL | Status: DC | PRN
  Administered 2018-12-07: 10 mg via ORAL

## 2018-12-06 MED ORDER — MIDAZOLAM HCL 2 MG/2ML IJ SOLN
INTRAMUSCULAR | Status: DC | PRN
  Administered 2018-12-06: 2 mg via INTRAVENOUS

## 2018-12-06 MED ORDER — GABAPENTIN 300 MG PO CAPS
300.0000 mg | ORAL_CAPSULE | Freq: Every day | ORAL | Status: DC
  Administered 2018-12-06 – 2018-12-07 (×2): 300 mg via ORAL
  Filled 2018-12-06 (×2): qty 1

## 2018-12-06 MED ORDER — ONDANSETRON HCL 4 MG/2ML IJ SOLN: 4.0000 mg | Freq: Once | INTRAMUSCULAR | Status: DC | PRN

## 2018-12-06 MED ORDER — LORATADINE 10 MG PO TABS
10.0000 mg | ORAL_TABLET | Freq: Every day | ORAL | Status: DC
  Administered 2018-12-07 – 2018-12-08 (×2): 10 mg via ORAL
  Filled 2018-12-06 (×2): qty 1

## 2018-12-06 MED ORDER — FENTANYL CITRATE (PF) 100 MCG/2ML IJ SOLN
INTRAMUSCULAR | Status: DC | PRN
  Administered 2018-12-06: 250 ug via INTRAVENOUS
  Administered 2018-12-06: 150 ug via INTRAVENOUS
  Administered 2018-12-06 (×2): 50 ug via INTRAVENOUS

## 2018-12-06 MED ORDER — CLINDAMYCIN PHOSPHATE 900 MG/50ML IV SOLN
INTRAVENOUS | Status: DC | PRN
  Administered 2018-12-06: 900 mg via INTRAVENOUS

## 2018-12-06 MED ORDER — CELECOXIB 200 MG PO CAPS
ORAL_CAPSULE | ORAL | Status: AC
  Administered 2018-12-06: 400 mg
  Filled 2018-12-06: qty 2

## 2018-12-06 MED ORDER — FAMOTIDINE 20 MG PO TABS: 20.0000 mg | ORAL_TABLET | Freq: Once | ORAL | Status: DC

## 2018-12-06 MED ORDER — LEVONORGEST-ETH ESTRAD 91-DAY 0.15-0.03 &0.01 MG PO TABS
1.0000 | ORAL_TABLET | Freq: Every day | ORAL | Status: DC
  Administered 2018-12-06 – 2018-12-07 (×2): 1 via ORAL
  Filled 2018-12-06 (×2): qty 1

## 2018-12-06 MED ORDER — LACTATED RINGERS IV SOLN
INTRAVENOUS | Status: DC
  Administered 2018-12-06: 13:00:00 via INTRAVENOUS

## 2018-12-06 MED ORDER — OXYCODONE HCL 5 MG PO TABS
10.0000 mg | ORAL_TABLET | ORAL | Status: DC | PRN
  Administered 2018-12-07 – 2018-12-08 (×2): 10 mg via ORAL
  Filled 2018-12-06 (×2): qty 2

## 2018-12-06 MED ORDER — FENTANYL CITRATE (PF) 100 MCG/2ML IJ SOLN
25.0000 ug | INTRAMUSCULAR | Status: DC | PRN
  Administered 2018-12-06 (×4): 25 ug via INTRAVENOUS

## 2018-12-06 MED ORDER — MIDAZOLAM HCL 2 MG/2ML IJ SOLN
INTRAMUSCULAR | Status: AC
  Filled 2018-12-06: qty 2

## 2018-12-06 MED ORDER — FENTANYL CITRATE (PF) 100 MCG/2ML IJ SOLN
INTRAMUSCULAR | Status: AC
  Administered 2018-12-06: 25 ug via INTRAVENOUS
  Filled 2018-12-06: qty 2

## 2018-12-06 MED ORDER — SUMATRIPTAN SUCCINATE 50 MG PO TABS
50.0000 mg | ORAL_TABLET | ORAL | Status: DC | PRN
  Filled 2018-12-06: qty 1

## 2018-12-06 MED ORDER — SUGAMMADEX SODIUM 200 MG/2ML IV SOLN
INTRAVENOUS | Status: DC | PRN
  Administered 2018-12-06: 200 mg via INTRAVENOUS

## 2018-12-06 MED ORDER — ENOXAPARIN SODIUM 30 MG/0.3ML ~~LOC~~ SOLN
30.0000 mg | Freq: Two times a day (BID) | SUBCUTANEOUS | Status: DC
  Administered 2018-12-07 – 2018-12-08 (×3): 30 mg via SUBCUTANEOUS
  Filled 2018-12-06 (×3): qty 0.3

## 2018-12-06 MED ORDER — METOCLOPRAMIDE HCL 5 MG/ML IJ SOLN: 5.0000 mg | Freq: Three times a day (TID) | INTRAMUSCULAR | Status: DC | PRN

## 2018-12-06 MED ORDER — PANTOPRAZOLE SODIUM 40 MG PO TBEC
40.0000 mg | DELAYED_RELEASE_TABLET | Freq: Two times a day (BID) | ORAL | Status: DC
  Administered 2018-12-06 – 2018-12-08 (×4): 40 mg via ORAL
  Filled 2018-12-06 (×4): qty 1

## 2018-12-06 MED ORDER — TRANEXAMIC ACID-NACL 1000-0.7 MG/100ML-% IV SOLN
INTRAVENOUS | Status: DC | PRN
  Administered 2018-12-06: 1000 mg via INTRAVENOUS

## 2018-12-06 MED ORDER — ROCURONIUM BROMIDE 100 MG/10ML IV SOLN
INTRAVENOUS | Status: DC | PRN
  Administered 2018-12-06: 50 mg via INTRAVENOUS
  Administered 2018-12-06: 100 mg via INTRAVENOUS

## 2018-12-06 MED ORDER — TRAMADOL HCL 50 MG PO TABS
50.0000 mg | ORAL_TABLET | ORAL | Status: DC | PRN
  Administered 2018-12-07: 50 mg via ORAL
  Administered 2018-12-07: 100 mg via ORAL
  Filled 2018-12-06: qty 1
  Filled 2018-12-06: qty 2

## 2018-12-06 MED ORDER — ALUM & MAG HYDROXIDE-SIMETH 200-200-20 MG/5ML PO SUSP: 30.0000 mL | ORAL | Status: DC | PRN

## 2018-12-06 MED ORDER — GABAPENTIN 300 MG PO CAPS
ORAL_CAPSULE | ORAL | Status: AC
  Administered 2018-12-06: 300 mg
  Filled 2018-12-06: qty 1

## 2018-12-06 MED ORDER — SENNOSIDES-DOCUSATE SODIUM 8.6-50 MG PO TABS
1.0000 | ORAL_TABLET | Freq: Two times a day (BID) | ORAL | Status: DC
  Administered 2018-12-06 – 2018-12-07 (×2): 1 via ORAL
  Filled 2018-12-06 (×4): qty 1

## 2018-12-06 MED ORDER — CLINDAMYCIN PHOSPHATE 600 MG/50ML IV SOLN
600.0000 mg | Freq: Four times a day (QID) | INTRAVENOUS | Status: AC
  Administered 2018-12-06 – 2018-12-07 (×4): 600 mg via INTRAVENOUS
  Filled 2018-12-06 (×4): qty 50

## 2018-12-06 MED ORDER — METOCLOPRAMIDE HCL 10 MG PO TABS
10.0000 mg | ORAL_TABLET | Freq: Three times a day (TID) | ORAL | Status: DC
  Administered 2018-12-06 – 2018-12-08 (×6): 10 mg via ORAL
  Filled 2018-12-06 (×6): qty 1

## 2018-12-06 MED ORDER — CELECOXIB 200 MG PO CAPS
200.0000 mg | ORAL_CAPSULE | Freq: Two times a day (BID) | ORAL | Status: DC
  Administered 2018-12-06 – 2018-12-08 (×4): 200 mg via ORAL
  Filled 2018-12-06 (×4): qty 1

## 2018-12-06 MED ORDER — BUPIVACAINE HCL (PF) 0.25 % IJ SOLN
INTRAMUSCULAR | Status: DC | PRN
  Administered 2018-12-06: 60 mL

## 2018-12-06 SURGICAL SUPPLY — 68 items
ATTUNE MED DOME PAT 32 KNEE (Knees) ×2 IMPLANT
BASE TIBIAL ROT PLAT SZ 3 KNEE (Knees) ×1 IMPLANT
BATTERY INSTRU NAVIGATION (MISCELLANEOUS) ×8 IMPLANT
BLADE SAW 70X12.5 (BLADE) ×2 IMPLANT
BLADE SAW 90X13X1.19 OSCILLAT (BLADE) ×2 IMPLANT
BLADE SAW 90X25X1.19 OSCILLAT (BLADE) ×2 IMPLANT
CANISTER SUCT 1200ML W/VALVE (MISCELLANEOUS) ×2 IMPLANT
CANISTER SUCT 3000ML PPV (MISCELLANEOUS) ×4 IMPLANT
CEMENT HV SMART SET (Cement) ×4 IMPLANT
COMP FEM CMT ATTUNE SZ2 LT (Femur) ×2 IMPLANT
COMPONENT FEM CMT ATTUN SZ2 LT (Femur) ×1 IMPLANT
COOLER POLAR GLACIER W/PUMP (MISCELLANEOUS) ×2 IMPLANT
COVER WAND RF STERILE (DRAPES) IMPLANT
CUFF TOURN 24 STER (MISCELLANEOUS) IMPLANT
CUFF TOURN 30 STER DUAL PORT (MISCELLANEOUS) ×2 IMPLANT
DRAPE SHEET LG 3/4 BI-LAMINATE (DRAPES) ×2 IMPLANT
DRSG DERMACEA 8X12 NADH (GAUZE/BANDAGES/DRESSINGS) ×2 IMPLANT
DRSG OPSITE POSTOP 4X14 (GAUZE/BANDAGES/DRESSINGS) ×2 IMPLANT
DRSG TEGADERM 4X4.75 (GAUZE/BANDAGES/DRESSINGS) ×2 IMPLANT
DURAPREP 26ML APPLICATOR (WOUND CARE) ×4 IMPLANT
ELECT REM PT RETURN 9FT ADLT (ELECTROSURGICAL) ×2
ELECTRODE REM PT RTRN 9FT ADLT (ELECTROSURGICAL) ×1 IMPLANT
EX-PIN ORTHOLOCK NAV 4X150 (PIN) ×4 IMPLANT
GLOVE BIOGEL M STRL SZ7.5 (GLOVE) ×4 IMPLANT
GLOVE BIOGEL PI IND STRL 7.5 (GLOVE) ×6 IMPLANT
GLOVE BIOGEL PI INDICATOR 7.5 (GLOVE) ×6
GLOVE INDICATOR 8.0 STRL GRN (GLOVE) ×2 IMPLANT
GOWN STRL REUS W/ TWL LRG LVL3 (GOWN DISPOSABLE) ×2 IMPLANT
GOWN STRL REUS W/TWL LRG LVL3 (GOWN DISPOSABLE) ×2
HEMOVAC 400CC 10FR (MISCELLANEOUS) ×2 IMPLANT
HOLDER FOLEY CATH W/STRAP (MISCELLANEOUS) ×2 IMPLANT
HOOD PEEL AWAY FLYTE STAYCOOL (MISCELLANEOUS) ×4 IMPLANT
INSERT TIBIAL RP ATTUNE SZ2X8 (Insert) ×2 IMPLANT
KIT TURNOVER KIT A (KITS) ×2 IMPLANT
KNIFE SCULPS 14X20 (INSTRUMENTS) ×2 IMPLANT
LABEL OR SOLS (LABEL) ×2 IMPLANT
MANIFOLD NEPTUNE WASTE (CANNULA) ×2 IMPLANT
NDL SAFETY ECLIPSE 18X1.5 (NEEDLE) ×1 IMPLANT
NEEDLE HYPO 18GX1.5 SHARP (NEEDLE) ×1
NEEDLE SPNL 20GX3.5 QUINCKE YW (NEEDLE) ×4 IMPLANT
NS IRRIG 500ML POUR BTL (IV SOLUTION) ×2 IMPLANT
PACK TOTAL KNEE (MISCELLANEOUS) ×2 IMPLANT
PAD WRAPON POLAR KNEE (MISCELLANEOUS) ×1 IMPLANT
PENCIL SMOKE ULTRAEVAC 22 CON (MISCELLANEOUS) ×2 IMPLANT
PIN DRILL QUICK PACK ×2 IMPLANT
PIN FIXATION 1/8DIA X 3INL (PIN) ×6 IMPLANT
PULSAVAC PLUS IRRIG FAN TIP (DISPOSABLE) ×2
SOL .9 NS 3000ML IRR  AL (IV SOLUTION) ×1
SOL .9 NS 3000ML IRR UROMATIC (IV SOLUTION) ×1 IMPLANT
SOL PREP PVP 2OZ (MISCELLANEOUS) ×2
SOLUTION PREP PVP 2OZ (MISCELLANEOUS) ×1 IMPLANT
SPONGE DRAIN TRACH 4X4 STRL 2S (GAUZE/BANDAGES/DRESSINGS) ×2 IMPLANT
STAPLER SKIN PROX 35W (STAPLE) ×2 IMPLANT
STOCKINETTE IMPERV 14X48 (MISCELLANEOUS) IMPLANT
STRAP TIBIA SHORT (MISCELLANEOUS) ×2 IMPLANT
SUCTION FRAZIER HANDLE 10FR (MISCELLANEOUS) ×1
SUCTION TUBE FRAZIER 10FR DISP (MISCELLANEOUS) ×1 IMPLANT
SUT VIC AB 0 CT1 36 (SUTURE) ×2 IMPLANT
SUT VIC AB 1 CT1 36 (SUTURE) ×4 IMPLANT
SUT VIC AB 2-0 CT2 27 (SUTURE) ×2 IMPLANT
SYR 20CC LL (SYRINGE) ×2 IMPLANT
SYR 30ML LL (SYRINGE) ×4 IMPLANT
TIBIAL BASE ROT PLAT SZ 3 KNEE (Knees) ×2 IMPLANT
TIP FAN IRRIG PULSAVAC PLUS (DISPOSABLE) ×1 IMPLANT
TOWEL OR 17X26 4PK STRL BLUE (TOWEL DISPOSABLE) ×2 IMPLANT
TOWER CARTRIDGE SMART MIX (DISPOSABLE) ×2 IMPLANT
TRAY FOLEY MTR SLVR 16FR STAT (SET/KITS/TRAYS/PACK) ×2 IMPLANT
WRAPON POLAR PAD KNEE (MISCELLANEOUS) ×2

## 2018-12-06 NOTE — Anesthesia Preprocedure Evaluation (Signed)
Anesthesia Evaluation  Patient identified by MRN, date of birth, ID band Patient awake    Reviewed: Allergy & Precautions, H&P , NPO status , Patient's Chart, lab work & pertinent test results, reviewed documented beta blocker date and time   Airway Mallampati: II  TM Distance: >3 FB Neck ROM: full    Dental  (+) Teeth Intact   Pulmonary neg pulmonary ROS,    Pulmonary exam normal        Cardiovascular Exercise Tolerance: Good hypertension, negative cardio ROS Normal cardiovascular exam Rhythm:regular Rate:Normal     Neuro/Psych  Headaches, negative psych ROS   GI/Hepatic negative GI ROS, Neg liver ROS,   Endo/Other  negative endocrine ROS  Renal/GU negative Renal ROS  negative genitourinary   Musculoskeletal   Abdominal   Peds  Hematology negative hematology ROS (+)   Anesthesia Other Findings Past Medical History: No date: Hypertension No date: Migraines Past Surgical History: 09/08/2017: KNEE ARTHROSCOPY WITH MEDIAL MENISECTOMY; Left     Comment:  Procedure: KNEE ARTHROSCOPY WITH PARTIAL MEDIAL               MENISECTOMY;  Surgeon: Kennedy Bucker, MD;  Location: [facility]              ORS;  Service: Orthopedics;  Laterality: Left; 09/08/2017: SYNOVECTOMY; Left     Comment:  Procedure: PartiaL SYNOVECTOMY;  Surgeon: Kennedy Bucker,              MD;  Location: [facility] ORS;  Service: Orthopedics;                Laterality: Left; BMI    Body Mass Index:  33.31 kg/m     Reproductive/Obstetrics negative OB ROS                             Anesthesia Physical Anesthesia Plan  ASA: II  Anesthesia Plan: General ETT   Post-op Pain Management:  Regional for Post-op pain   Induction:   PONV Risk Score and Plan:   Airway Management Planned:   Additional Equipment:   Intra-op Plan:   Post-operative Plan:   Informed Consent: I have reviewed the patients History and Physical, chart, labs  and discussed the procedure including the risks, benefits and alternatives for the proposed anesthesia with the patient or authorized representative who has indicated his/her understanding and acceptance.     Dental Advisory Given  Plan Discussed with: CRNA  Anesthesia Plan Comments:         Anesthesia Quick Evaluation

## 2018-12-06 NOTE — Transfer of Care (Signed)
Immediate Anesthesia Transfer of Care Note  Patient: Alexandria [name]  Procedure(s) Performed: TOTAL KNEE ARTHROPLASTY (Left Knee)  Patient Location: PACU  Anesthesia Type:General  Level of Consciousness: sedated  Airway & Oxygen Therapy: Patient Spontanous Breathing and Patient connected to face mask oxygen  Post-op Assessment: Report given to RN and Post -op Vital signs reviewed and stable  Post vital signs: Reviewed and stable  Last Vitals:  Vitals Value Taken Time  BP 120/75 12/06/2018  3:40 PM  Temp 36.6 C 12/06/2018  3:40 PM  Pulse 79 12/06/2018  3:46 PM  Resp 16 12/06/2018  3:46 PM  SpO2 98 % 12/06/2018  3:46 PM  Vitals shown include unvalidated device data.  Last Pain:  Vitals:   12/06/18 1540  TempSrc:   PainSc: 0-No pain         Complications: No apparent anesthesia complications

## 2018-12-06 NOTE — Op Note (Signed)
OPERATIVE NOTE  DATE OF SURGERY:  12/06/2018  PATIENT NAME:  Alexandria [name]   DOB: 05-03-1971  MRN: [id number]  PRE-OPERATIVE DIAGNOSIS: Degenerative arthrosis of the left knee, primary  POST-OPERATIVE DIAGNOSIS:  Same  PROCEDURE:  Left total knee arthroplasty using computer-assisted navigation  SURGEON:  Jena Gauss. M.D.  ASSISTANT: Benancio Deeds, PA-C (present and scrubbed throughout the case, critical for assistance with exposure, retraction, instrumentation, and closure)  ANESTHESIA: general  ESTIMATED BLOOD LOSS: 50 mL  FLUIDS REPLACED: 1400 mL of crystalloid  TOURNIQUET TIME: 104 minutes  DRAINS: 2 medium Hemovac drains  SOFT TISSUE RELEASES: Anterior cruciate ligament, posterior cruciate ligament, deep medial collateral ligament, patellofemoral ligament  IMPLANTS UTILIZED: DePuy Attune size 2 posterior stabilized femoral component (cemented), size 3 rotating platform tibial component (cemented), 32 mm medialized dome patella (cemented), and a 8 mm stabilized rotating platform polyethylene insert.  INDICATIONS FOR SURGERY: Alexandria [name] is a 48 y.o. year old female with a long history of progressive knee pain. X-rays demonstrated severe degenerative changes in tricompartmental fashion. The patient had not seen any significant improvement despite conservative nonsurgical intervention. After discussion of the risks and benefits of surgical intervention, the patient expressed understanding of the risks benefits and agree with plans for total knee arthroplasty.   The risks, benefits, and alternatives were discussed at length including but not limited to the risks of infection, bleeding, nerve injury, stiffness, blood clots, the need for revision surgery, cardiopulmonary complications, among others, and they were willing to proceed.  PROCEDURE IN DETAIL: The patient was brought into the operating room and, after adequate general anesthesia was achieved, a tourniquet  was placed on the patient's upper thigh. The patient's knee and leg were cleaned and prepped with alcohol and DuraPrep and draped in the usual sterile fashion. A "timeout" was performed as per usual protocol. The lower extremity was exsanguinated using an Esmarch, and the tourniquet was inflated to 300 mmHg. An anterior longitudinal incision was made followed by a standard mid vastus approach. The deep fibers of the medial collateral ligament were elevated in a subperiosteal fashion off of the medial flare of the tibia so as to maintain a continuous soft tissue sleeve. The patella was subluxed laterally and the patellofemoral ligament was incised. Inspection of the knee demonstrated severe degenerative changes with full-thickness loss of articular cartilage. Osteophytes were debrided using a rongeur. Anterior and posterior cruciate ligaments were excised. Two 4.0 mm Schanz pins were inserted in the femur and into the tibia for attachment of the array of trackers used for computer-assisted navigation. Hip center was identified using a circumduction technique. Distal landmarks were mapped using the computer. The distal femur and proximal tibia were mapped using the computer. The distal femoral cutting guide was positioned using computer-assisted navigation so as to achieve a 5 distal valgus cut. The femur was sized and it was felt that a size 2 femoral component was appropriate. A size 2 femoral cutting guide was positioned and the anterior cut was performed and verified using the computer. This was followed by completion of the posterior and chamfer cuts. Femoral cutting guide for the central box was then positioned in the center box cut was performed.  Attention was then directed to the proximal tibia. Medial and lateral menisci were excised. The extramedullary tibial cutting guide was positioned using computer-assisted navigation so as to achieve a 0 varus-valgus alignment and 3 posterior slope. The cut was  performed and verified using the computer. The proximal tibia was  sized and it was felt that a size 3 tibial tray was appropriate. Tibial and femoral trials were inserted followed by insertion of an 8 mm polyethylene insert. This allowed for excellent mediolateral soft tissue balancing both in flexion and in full extension. Finally, the patella was cut and prepared so as to accommodate a 32 mm medialized dome patella. A patella trial was placed and the knee was placed through a range of motion with excellent patellar tracking appreciated. The femoral trial was removed after debridement of posterior osteophytes. The central post-hole for the tibial component was reamed followed by insertion of a keel punch. Tibial trials were then removed. Cut surfaces of bone were irrigated with copious amounts of normal saline with antibiotic solution using pulsatile lavage and then suctioned dry. Polymethylmethacrylate cement was prepared in the usual fashion using a vacuum mixer. Cement was applied to the cut surface of the proximal tibia as well as along the undersurface of a size 3 rotating platform tibial component. Tibial component was positioned and impacted into place. Excess cement was removed using Personal assistant. Cement was then applied to the cut surfaces of the femur as well as along the posterior flanges of the size 2 femoral component. The femoral component was positioned and impacted into place. Excess cement was removed using Personal assistant. An 8 mm polyethylene trial was inserted and the knee was brought into full extension with steady axial compression applied. Finally, cement was applied to the backside of a 32 mm medialized dome patella and the patellar component was positioned and patellar clamp applied. Excess cement was removed using Personal assistant. After adequate curing of the cement, the tourniquet was deflated after a total tourniquet time of 104 minutes. Hemostasis was achieved using electrocautery.  The knee was irrigated with copious amounts of normal saline with antibiotic solution using pulsatile lavage and then suctioned dry. 20 mL of 1.3% Exparel and 60 mL of 0.25% Marcaine in 40 mL of normal saline was injected along the posterior capsule, medial and lateral gutters, and along the arthrotomy site. An 8 mm stabilized rotating platform polyethylene insert was inserted and the knee was placed through a range of motion with excellent mediolateral soft tissue balancing appreciated and excellent patellar tracking noted. 2 medium drains were placed in the wound bed and brought out through separate stab incisions. The medial parapatellar portion of the incision was reapproximated using interrupted sutures of #1 Vicryl. Subcutaneous tissue was approximated in layers using first #0 Vicryl followed #2-0 Vicryl. The skin was approximated with skin staples. A sterile dressing was applied.  The patient tolerated the procedure well and was transported to the recovery room in stable condition.    Robertta Halfhill P. Angie Fava., M.D.

## 2018-12-06 NOTE — Anesthesia Procedure Notes (Signed)
Procedure Name: Intubation Date/Time: 12/06/2018 12:03 PM Performed by: Bernardo Heater, CRNA Pre-anesthesia Checklist: Patient identified, Emergency Drugs available, Suction available and Patient being monitored Patient Re-evaluated:Patient Re-evaluated prior to induction Oxygen Delivery Method: Circle system utilized Preoxygenation: Pre-oxygenation with 100% oxygen Induction Type: IV induction Laryngoscope Size: Mac and 3 Grade View: Grade I Tube size: 7.0 mm Number of attempts: 1 Airway Equipment and Method: Stylet Placement Confirmation: ETT inserted through vocal cords under direct vision,  positive ETCO2 and breath sounds checked- equal and bilateral Secured at: 21 cm Tube secured with: Tape Dental Injury: Teeth and Oropharynx as per pre-operative assessment

## 2018-12-06 NOTE — Anesthesia Post-op Follow-up Note (Signed)
Anesthesia QCDR form completed.        

## 2018-12-06 NOTE — H&P (Signed)
The patient has been re-examined, and the chart reviewed, and there have been no interval changes to the documented history and physical.    The risks, benefits, and alternatives have been discussed at length. The patient expressed understanding of the risks benefits and agreed with plans for surgical intervention.  Haylo Fake P. Koben Daman, Jr. M.D.    

## 2018-12-07 ENCOUNTER — Encounter: Payer: Self-pay | Admitting: Orthopedic Surgery

## 2018-12-07 NOTE — Progress Notes (Signed)
Clinical Social Worker (CSW) received SNF consult. PT is recommending home health. RN case manager aware of above. Please reconsult if future social work needs arise. CSW signing off.   Marthena Whitmyer, LCSW [phone] 

## 2018-12-07 NOTE — Evaluation (Signed)
Occupational Therapy Evaluation Patient Details Name: Alexandria [name] MRN: [id number] DOB: 1970-12-31 Today's Date: 12/07/2018    History of Present Illness 48 y/o female s/p L TKA 12/06/18.   Clinical Impression   Pt seen for OT evaluation this date, POD#1 from above surgery. Pt was independent in all ADLs prior to surgery and is eager to return to PLOF with less pain and improved safety and independence. Pt currently requires PRN minimal assist for LB dressing and bathing while in seated position due to pain and limited AROM of L knee. Pt/family instructed in polar care mgt, falls prevention strategies including pet care considerations, home/routines modifications, DME/AE for LB bathing and dressing tasks, and compression stocking mgt. Pt/family verbalized understanding. Do not currently anticipate any further skilled OT needs at this time. Will sign off. Please re-consult if additional needs arise.     Follow Up Recommendations  No OT follow up    Equipment Recommendations  None recommended by OT    Recommendations for Other Services       Precautions / Restrictions Precautions Precautions: Fall Restrictions Weight Bearing Restrictions: Yes LLE Weight Bearing: Weight bearing as tolerated      Mobility Bed Mobility              General bed mobility comments: deferred, up in recliner at start/end of session  Transfers Overall transfer level: Independent Equipment used: Rolling walker (2 wheeled)             General transfer comment: Pt is able to rise standing relatively easily, no phyiscal assist needed, good safety/confidence    Balance Overall balance assessment: Independent                                         ADL either performed or assessed with clinical judgement   ADL Overall ADL's : Needs assistance/impaired                                       General ADL Comments: Pt requires Min A for LB ADL tasks 2/2 L  knee pain/decr strength/ROM, spouse able to provide needed level of assist     Vision Baseline Vision/History: Wears glasses Wears Glasses: Reading only Patient Visual Report: No change from baseline       Perception     Praxis      Pertinent Vitals/Pain Pain Assessment: 0-10 Pain Score: 6  Pain Location: L knee Pain Descriptors / Indicators: Aching Pain Intervention(s): Limited activity within patient's tolerance;Monitored during session;Repositioned;Ice applied     Hand Dominance Right   Extremity/Trunk Assessment Upper Extremity Assessment Upper Extremity Assessment: Overall WFL for tasks assessed   Lower Extremity Assessment Lower Extremity Assessment: Overall WFL for tasks assessed(expected post-op weakness, but good SLRs and quad sets on L)   Cervical / Trunk Assessment Cervical / Trunk Assessment: Normal   Communication Communication Communication: No difficulties   Cognition Arousal/Alertness: Awake/alert Behavior During Therapy: WFL for tasks assessed/performed Overall Cognitive Status: Within Functional Limits for tasks assessed                                     General Comments       Exercises  Other Exercises Other Exercises: pt/family instructed  in polar care mgt, compression stocking mgt, AE/DME, falls prevention, and pet care considerations to maximize safety/independence   Shoulder Instructions      Home Living Family/patient expects to be discharged to:: Private residence Living Arrangements: Spouse/significant other;Children Available Help at Discharge: Family;Available 24 hours/day Type of Home: House Home Access: Stairs to enter [employer] of Steps: 4 Entrance Stairs-Rails: Can reach both Home Layout: Able to live on main level with bedroom/bathroom     Bathroom Shower/Tub: Chief Strategy Officer: Standard     Home Equipment: Environmental consultant - 2 wheels;Grab bars - tub/shower;Grab bars - toilet    Additional Comments: has access to [facility] and shower chair if needed      Prior Functioning/Environment Level of Independence: Independent        Comments: Pt reports that until knee pain became too bad she was very active and independent. 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, theatre/television/film Problem List: Decreased range of motion;Decreased strength;Pain      OT Treatment/Interventions:      OT Goals(Current goals can be found in the care plan section) Acute Rehab OT Goals Patient Stated Goal: go home tomorrow OT Goal Formulation: All assessment and education complete, DC therapy  OT Frequency:     Barriers to D/C:            Co-evaluation              AM-PAC OT "6 Clicks" Daily Activity     Outcome Measure Help from another person eating meals?: None Help from another person taking care of personal grooming?: None Help from another person toileting, which includes using toliet, bedpan, or urinal?: A Little Help from another person bathing (including washing, rinsing, drying)?: A Little Help from another person to put on and taking off regular upper body clothing?: None Help from another person to put on and taking off regular lower body clothing?: A Little 6 Click Score: 21   End of Session    Activity Tolerance: Patient tolerated treatment well Patient left: in chair;with call bell/phone within reach;with chair alarm set;with family/visitor present;with SCD's reapplied;Other (comment)(polar care in place)  OT Visit Diagnosis: Other abnormalities of gait and mobility (R26.89);Pain Pain - Right/Left: Left Pain - part of body: Knee                Time: 4580-9983 OT Time Calculation (min): 13 min Charges:  OT General Charges $OT Visit: 1 Visit OT Evaluation $OT Eval Low Complexity: 1 Low OT Treatments $Self Care/Home Management : 8-22 mins  Richrd Prime, MPH, MS, OTR/L ascom [phone] 12/07/18, 1:51 PM

## 2018-12-07 NOTE — Progress Notes (Signed)
  Subjective: 1 Day Post-Op Procedure(s) (LRB): TOTAL KNEE ARTHROPLASTY (Left) Patient reports pain as mild.   Patient seen in rounds with Dr. Ernest Pine. Patient is well, and has had no acute complaints or problems Plan is to go Home after hospital stay. Negative for chest pain and shortness of breath Fever: no Gastrointestinal: Negative for nausea and vomiting  Objective: Vital signs in last 24 hours: Temp:  [97 F (36.1 C)-97.8 F (36.6 C)] 97.4 F (36.3 C) (02/26 2314) Pulse Rate:  [71-98] 79 (02/27 0426) Resp:  [11-19] 18 (02/27 0426) BP: (107-149)/(58-88) 107/58 (02/27 0426) SpO2:  [91 %-100 %] 98 % (02/27 0426) Weight:  [85.3 kg] 85.3 kg (02/26 1010)  Intake/Output from previous day:  Intake/Output Summary (Last 24 hours) at 12/07/2018 0559 Last data filed at 12/07/2018 0400 Gross per 24 hour  Intake 3013.06 ml  Output 655 ml  Net 2358.06 ml    Intake/Output this shift: Total I/O In: 1048.2 [I.V.:848.2; IV Piggyback:200] Out: 95 [Drains:95]  Labs: No results for input(s): HGB in the last 72 hours. No results for input(s): WBC, RBC, HCT, PLT in the last 72 hours. No results for input(s): NA, K, CL, CO2, BUN, CREATININE, GLUCOSE, CALCIUM in the last 72 hours. No results for input(s): LABPT, INR in the last 72 hours.   EXAM General - Patient is Alert and Oriented Extremity - Sensation intact distally Dorsiflexion/Plantar flexion intact Compartment soft Dressing/Incision - clean, dry, with Hemovac intact Motor Function - intact, moving foot and toes well on exam.  Able to straight leg raise independently  Past Medical History:  Diagnosis Date  . Hypertension   . Migraines     Assessment/Plan: 1 Day Post-Op Procedure(s) (LRB): TOTAL KNEE ARTHROPLASTY (Left) Active Problems:   Total knee replacement status  Estimated body mass index is 33.31 kg/m as calculated from the following:   Height as of this encounter: 5\' 3"  (1.6 m).   Weight as of this encounter:  85.3 kg. Advance diet Up with therapy D/C IV fluids Discharge home with home health probable tomorrow  DVT Prophylaxis - Lovenox, Foot Pumps and TED hose Weight-Bearing as tolerated to left leg  Dedra Skeens, PA-C Orthopaedic Surgery 12/07/2018, 5:59 AM

## 2018-12-07 NOTE — Care Management (Signed)
#  2.   S/W JEN @   [facility] # [phone]   1. LOVENOX  40 MG QD FOR 14 DAYS  SYRINGES COVER- YES CO-PAY- $ 264.81 TIER- 4 DRUG PRIOR APPROVAL- NO  2. ENOXAPARIN  40 MG QD  FOR 14 DAYS  SYRINGES COVER- YES CO-PAY- $ 100.00 TIER- NO PRIOR APPROVAL- NO  DEDUCTIBLE : NOT MET  PREFERRED PHARMACY : YES [place]

## 2018-12-07 NOTE — Care Management (Signed)
Sent Secure inbox message for CMA to check Copay and benefit for Lovenox 40 Mg QD for 14 days, will give information to patient once obtained

## 2018-12-07 NOTE — Care Management (Signed)
Called [facility] at [phone] to check the price of Lovenox 40 mg once daily for 14 days $85.00 with out insurance or $100.00 with insurance, notified the patient

## 2018-12-07 NOTE — Evaluation (Signed)
Physical Therapy Evaluation Patient Details Name: Alexandria [name] MRN: [id number] DOB: April 12, 1971 Today's Date: 12/07/2018   History of Present Illness  48 y/o female s/p L TKA 12/06/18.  Clinical Impression  Pt did well with PT exam and far surpassed typical POD1 expectations.  She showed great effort though she did c/o significant pain t/o the session despite no overt signs of being functionally limited due to this.  She has >90 of flexion, easily did SLRs and was able to circumambulate the nurses' station with consistent speed, confidence and good performance.  Pt doing well and per PT perspective she should be able to return home tomorrow depending on medical status.     Follow Up Recommendations Home health PT;Follow surgeon's recommendation for DC plan and follow-up therapies    Equipment Recommendations  None recommended by PT    Recommendations for Other Services       Precautions / Restrictions Precautions Precautions: Fall Restrictions LLE Weight Bearing: Weight bearing as tolerated      Mobility  Bed Mobility Overal bed mobility: Independent             General bed mobility comments: easily gets to sitting EOB  Transfers Overall transfer level: Independent Equipment used: Rolling walker (2 wheeled)             General transfer comment: Pt is able to rise standing relatively easily, no phyiscal assist needed, good safety/confidence  Ambulation/Gait Ambulation/Gait assistance: Modified independent (Device/Increase time) Gait Distance (Feet): 200 Feet Assistive device: Rolling walker (2 wheeled)       General Gait Details: Pt able to ambulate with good confidence and consistent cadence, initially stiff legged, but able to improve this with cuing and increased time.  Overall showed good speed, WBing tolerance and consistent walker momentum with no safety issues or significant fatigue.   Stairs            Wheelchair Mobility    Modified Rankin  (Stroke Patients Only)       Balance Overall balance assessment: Independent                                           Pertinent Vitals/Pain Pain Assessment: 0-10 Pain Score: 8  Pain Location: L knee    Home Living Family/patient expects to be discharged to:: Private residence Living Arrangements: Spouse/significant other;Children Available Help at Discharge: Family;Available 24 hours/day   Home Access: Stairs to enter Entrance Stairs-Rails: Can reach both Entrance Stairs-Number of Steps: 4 Home Layout: Able to live on main level with bedroom/bathroom Home Equipment: Walker - 2 wheels      Prior Function Level of Independence: Independent         Comments: Pt reports that until knee pain became too bad she was very active and independent     Hand Dominance        Extremity/Trunk Assessment   Upper Extremity Assessment Upper Extremity Assessment: Overall WFL for tasks assessed    Lower Extremity Assessment Lower Extremity Assessment: Overall WFL for tasks assessed(expected post-op weakness, but good SLRs and quad sets on L)       Communication   Communication: No difficulties  Cognition Arousal/Alertness: Awake/alert Behavior During Therapy: WFL for tasks assessed/performed Overall Cognitive Status: Within Functional Limits for tasks assessed  General Comments      Exercises Total Joint Exercises Ankle Circles/Pumps: AROM;10 reps Quad Sets: Strengthening;10 reps Heel Slides: Strengthening;10 reps Hip ABduction/ADduction: Strengthening;10 reps Straight Leg Raises: AROM;Strengthening;10 reps Long Arc Quad: Strengthening;10 reps Knee Flexion: PROM;5 reps Goniometric ROM: 0-92   Assessment/Plan    PT Assessment Patient needs continued PT services  PT Problem List Decreased strength;Decreased range of motion;Decreased activity tolerance;Decreased balance;Decreased  mobility;Decreased coordination;Decreased knowledge of use of DME;Decreased safety awareness;Pain       PT Treatment Interventions DME instruction;Gait training;Stair training;Functional mobility training;Therapeutic activities;Therapeutic exercise;Balance training;Neuromuscular re-education;Patient/family education    PT Goals (Current goals can be found in the Care Plan section)  Acute Rehab PT Goals Patient Stated Goal: go home tomorrow PT Goal Formulation: With patient Time For Goal Achievement: 12/21/18 Potential to Achieve Goals: Good    Frequency BID   Barriers to discharge        Co-evaluation               AM-PAC PT "6 Clicks" Mobility  Outcome Measure Help needed turning from your back to your side while in a flat bed without using bedrails?: None Help needed moving from lying on your back to sitting on the side of a flat bed without using bedrails?: None Help needed moving to and from a bed to a chair (including a wheelchair)?: None Help needed standing up from a chair using your arms (e.g., wheelchair or bedside chair)?: None Help needed to walk in hospital room?: None Help needed climbing 3-5 steps with a railing? : A Little 6 Click Score: 23    End of Session Equipment Utilized During Treatment: Gait belt Activity Tolerance: Patient limited by fatigue Patient left: with call bell/phone within reach;with chair alarm set;with family/visitor present Nurse Communication: Mobility status PT Visit Diagnosis: Muscle weakness (generalized) (M62.81);Difficulty in walking, not elsewhere classified (R26.2);Pain Pain - Right/Left: Left Pain - part of body: Knee    Time: 1010-1039 PT Time Calculation (min) (ACUTE ONLY): 29 min   Charges:   PT Evaluation $PT Eval Low Complexity: 1 Low PT Treatments $Gait Training: 8-22 mins $Therapeutic Exercise: 8-22 mins        Malachi Pro, DPT 12/07/2018, 11:01 AM

## 2018-12-07 NOTE — Care Management Note (Signed)
Case Management Note  Patient Details  Name: Alexandria [name] MRN: [id number] Date of Birth: 09/22/1971  Subjective/Objective:                  Met with the patient to discuss DC plan and needs, she has a RW at home and does not need any other DME, She has been provided the hh list per CMS.gov and a copy placed on the chart/ The patient uses [employer] and has transportation with husband and mom She refuses [facility] services and plans to go to outpatient on Monday, has appointment Lives at home with Husband, she knows she is not to drive  Action/Plan: [facility] list provided to the patient per CMS.gov NO DME needed, refuses HH services will go outpatient Check tht piece of lovenox prior to going home Expected Discharge Date:                  Expected Discharge Plan:     In-House Referral:     Discharge planning Services  CM Consult  Post Acute Care Choice:    Choice offered to:     DME Arranged:    DME Agency:     HH Arranged:    HH Agency:     Status of Service:     If discussed at [employer], dates discussed:    Additional Comments:  Su Hilt, RN 12/07/2018, 12:07 PM

## 2018-12-07 NOTE — [place] FL2 (Signed)
[place] MEDICAID FL2 LEVEL OF CARE SCREENING TOOL     IDENTIFICATION  Patient Name: Alexandria [name] Birthdate: January 18, 1971 Sex: female Admission Date (Current Location): 12/06/2018  [place] and [place] Number:  Chiropodist and Address:  [facility], [address], [place], [place] [address]      Provider Number: [id number]  Attending Physician Name and Address:  Donato Heinz, MD  Relative Name and Phone Number:       Current Level of Care: Hospital Recommended Level of Care: Skilled Nursing Facility Prior Approval Number:    Date Approved/Denied:   PASRR Number: ([id number])  Discharge Plan: SNF    Current Diagnoses: Patient Active Problem List   Diagnosis Date Noted  . 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 Primary osteoarthritis of both knees 09/17/2018  . Viral illness 01/17/2018  . Arthralgia 12/16/2017  . Vitamin D deficiency 12/16/2017  . Left ankle pain 12/16/2017  . Essential hypertension 05/25/2016  . Migraine 05/25/2016  . Allergic rhinitis 05/25/2016  . Oral contraceptive use 05/25/2016  . Osteoarthritis of right knee 05/25/2016    Orientation RESPIRATION BLADDER Height & Weight     Self, Time, Situation, Place  Normal Continent Weight: 188 lb 0.8 oz (85.3 kg) Height:   (160 cm)  BEHAVIORAL SYMPTOMS/MOOD NEUROLOGICAL BOWEL NUTRITION STATUS      Continent Diet(Diet: Regular )  AMBULATORY STATUS COMMUNICATION OF NEEDS Skin   Extensive Assist Verbally Surgical wounds(Incision: Left Knee. )                       Personal Care Assistance Level of Assistance  Bathing, Feeding, Dressing Bathing Assistance: Limited assistance Feeding assistance: Independent Dressing Assistance: Limited assistance     Functional Limitations Info  Sight, Hearing, Speech Sight Info: Adequate Hearing Info: Adequate Speech Info: Adequate    SPECIAL CARE FACTORS FREQUENCY  PT (By licensed PT), OT (By licensed  OT)     PT Frequency: (5) OT Frequency: (5)            Contractures      Additional Factors Info  Code Status, Allergies, Isolation Precautions Code Status Info: (Full Code. ) Allergies Info: (Darvon Propoxyphene, Doxycycline, Penicillins, Sulfa Antibiotics)     Isolation Precautions Info: (MRSA Nasal Swab. )     Current Medications (12/07/2018):  This is the current hospital active medication list Current Facility-Administered Medications  Medication Dose Route Frequency Provider Last Rate Last Dose  . 0.9 %  sodium chloride infusion   Intravenous Continuous Hooten, Illene Labrador, MD 100 mL/hr at 12/06/18 1816    . acetaminophen (OFIRMEV) IV 1,000 mg  1,000 mg Intravenous Q6H Hooten, Illene Labrador, MD 400 mL/hr at 12/07/18 0510 1,000 mg at 12/07/18 0510  . acetaminophen (TYLENOL) tablet 325-650 mg  325-650 mg Oral Q6H PRN Hooten, Illene Labrador, MD      . alum & mag hydroxide-simeth (MAALOX/MYLANTA) 200-200-20 MG/5ML suspension 30 mL  30 mL Oral Q4H PRN Hooten, Illene Labrador, MD      . amLODipine (NORVASC) tablet 10 mg  10 mg Oral Daily Hooten, Illene Labrador, MD   10 mg at 12/07/18 0913  . bisacodyl (DULCOLAX) suppository 10 mg  10 mg Rectal Daily PRN Hooten, Illene Labrador, MD      . celecoxib (CELEBREX) capsule 200 mg  200 mg Oral BID Donato Heinz, MD   200 mg at 12/07/18 0914  . clindamycin (CLEOCIN) IVPB 600 mg  600 mg Intravenous  Q6H Hooten, Illene Labrador, MD 100 mL/hr at 12/07/18 0606 600 mg at 12/07/18 0606  . diphenhydrAMINE (BENADRYL) 12.5 MG/5ML elixir 12.5-25 mg  12.5-25 mg Oral Q4H PRN Hooten, Illene Labrador, MD      . enoxaparin (LOVENOX) injection 30 mg  30 mg Subcutaneous Q12H Hooten, Illene Labrador, MD   30 mg at 12/07/18 0837  . ferrous sulfate tablet 325 mg  325 mg Oral BID WC Donato Heinz, MD   325 mg at 12/07/18 0837  . Fluocinolone Acetonide 0.01 % OIL 2 drop  2 drop OTIC (EAR) BID PRN Hooten, Illene Labrador, MD      . fluticasone (FLONASE) 50 MCG/ACT nasal spray 2 spray  2 spray Each Nare Daily PRN Hooten, Illene Labrador,  MD      . gabapentin (NEURONTIN) capsule 300 mg  300 mg Oral QHS Hooten, Illene Labrador, MD   300 mg at 12/06/18 2249  . HYDROmorphone (DILAUDID) injection 0.5-1 mg  0.5-1 mg Intravenous Q4H PRN Donato Heinz, MD   0.5 mg at 12/06/18 1746  . Levonorgestrel-Ethinyl Estradiol (AMETHIA,CAMRESE) 0.15-0.03 &0.01 MG tablet 1 tablet  1 tablet Oral QHS Hooten, Illene Labrador, MD   1 tablet at 12/06/18 2249  . loratadine (CLARITIN) tablet 10 mg  10 mg Oral Daily Hooten, Illene Labrador, MD   10 mg at 12/07/18 0914  . magnesium hydroxide (MILK OF MAGNESIA) suspension 30 mL  30 mL Oral Daily Hooten, Illene Labrador, MD   30 mL at 12/07/18 0913  . menthol-cetylpyridinium (CEPACOL) lozenge 3 mg  1 lozenge Oral PRN Hooten, Illene Labrador, MD       Or  . phenol (CHLORASEPTIC) mouth spray 1 spray  1 spray Mouth/Throat PRN Hooten, Illene Labrador, MD      . metoCLOPramide (REGLAN) tablet 5-10 mg  5-10 mg Oral Q8H PRN Hooten, Illene Labrador, MD       Or  . metoCLOPramide (REGLAN) injection 5-10 mg  5-10 mg Intravenous Q8H PRN Hooten, Illene Labrador, MD      . metoCLOPramide (REGLAN) tablet 10 mg  10 mg Oral TID AC & HS Hooten, Illene Labrador, MD   10 mg at 12/07/18 0837  . ondansetron (ZOFRAN) tablet 4 mg  4 mg Oral Q6H PRN Hooten, Illene Labrador, MD       Or  . ondansetron (ZOFRAN) injection 4 mg  4 mg Intravenous Q6H PRN Hooten, Illene Labrador, MD      . oxyCODONE (Oxy IR/ROXICODONE) immediate release tablet 10 mg  10 mg Oral Q4H PRN Hooten, Illene Labrador, MD      . oxyCODONE (Oxy IR/ROXICODONE) immediate release tablet 5 mg  5 mg Oral Q4H PRN Hooten, Illene Labrador, MD   5 mg at 12/07/18 0910  . pantoprazole (PROTONIX) EC tablet 40 mg  40 mg Oral BID Donato Heinz, MD   40 mg at 12/07/18 2706  . senna-docusate (Senokot-S) tablet 1 tablet  1 tablet Oral BID Donato Heinz, MD   1 tablet at 12/07/18 0914  . sodium phosphate (FLEET) 7-19 GM/118ML enema 1 enema  1 enema Rectal Once PRN Hooten, Illene Labrador, MD      . SUMAtriptan (IMITREX) tablet 50 mg  50 mg Oral Q2H PRN Hooten, Illene Labrador, MD      .  traMADol Janean Sark) tablet 50-100 mg  50-100 mg Oral Q4H PRN Hooten, Illene Labrador, MD         Discharge Medications: Please see discharge summary for a list of discharge medications.  Relevant Imaging Results:  Relevant Lab Results:   Additional Information (SSN: [id number])  Kyce Ging, Darleen Crocker, LCSW

## 2018-12-07 NOTE — Progress Notes (Signed)
Physical Therapy Treatment Patient Details Name: Alexandria [name] MRN: [id number] DOB: [birth year] Today's Date: 12/07/2018    History of Present Illness 48 y/o female s/p L TKA 12/06/18.    PT Comments    Pt agreeable to PT; reports 5/10 pain in L knee. Pt demonstrates good bed mobility to edge of bed; education on more equal use of LLE with STS. Participates well with seated and supine exercises with focus on stretching techniques, duration and frequency as well as eccentric control for strengthening with LAQ and SLR. Pt educated on positioning as well for carryover to home (pt initially found without ankle roll and LLE in ER with slight knee flexion. Pt educated as well regarding conservative approach to exercise/activity with shorter distances/sessions, but more frequency throughout the day during initial healing and using pain as a guide. Continue PT to progress strength, endurance and R knee active range of motion to allow for an optimal, safe return home. Will need stair training.    Follow Up Recommendations  Home health PT;Follow surgeon's recommendation for DC plan and follow-up therapies     Equipment Recommendations  None recommended by PT    Recommendations for Other Services       Precautions / Restrictions Precautions Precautions: Fall Restrictions Weight Bearing Restrictions: Yes LLE Weight Bearing: Weight bearing as tolerated    Mobility  Bed Mobility Overal bed mobility: Independent             General bed mobility comments: deferred, up in recliner at start/end of session  Transfers Overall transfer level: Modified independent Equipment used: Rolling walker (2 wheeled)             General transfer comment: Educated on equal use of BLEs   Ambulation/Gait             General Gait Details: Pt wished to defer ambulation this session due to soreness/fatigue. Felt she may have "overdone" ambulation this morning   Stairs              Wheelchair Mobility    Modified Rankin (Stroke Patients Only)       Balance Overall balance assessment: Independent                                          Cognition Arousal/Alertness: Awake/alert Behavior During Therapy: WFL for tasks assessed/performed Overall Cognitive Status: Within Functional Limits for tasks assessed                                        Exercises Total Joint Exercises Ankle Circles/Pumps: AROM;Both;20 reps Quad Sets: Strengthening;Left;20 reps Hip ABduction/ADduction: Strengthening;Left;20 reps;Supine;Other (comment)(focus on QS with/demonstrated seated as well) Straight Leg Raises: Strengthening;Left;10 reps;Supine(2 sets; focus on QS with eccentric control) Long Arc Quad: Strengthening;Left;10 reps;Seated Knee Flexion: AROM;Left;10 reps;Seated(3-4 positions each rep with holds) Other Exercises Other Exercises: abdominal brace with SLR and supine hip abd Other Exercises: Education regarding open/closed knee positioning for joint swelling management  Other Exercises: Education regarding pacing exercise/activity. shorter distances/times with more frequent bouts intitially    General Comments        Pertinent Vitals/Pain Pain Assessment: 0-10 Pain Score: 5  Pain Location: L knee Pain Descriptors / Indicators: Constant;Sore Pain Intervention(s): Limited activity within patient's tolerance;Monitored during session;Repositioned;Ice applied    Home Living  Family/patient expects to be discharged to:: Private residence Living Arrangements: Spouse/significant other;Children Available Help at Discharge: Family;Available 24 hours/day Type of Home: House Home Access: Stairs to enter Entrance Stairs-Rails: Can reach both Home Layout: Able to live on main level with bedroom/bathroom Home Equipment: Walker - 2 wheels;Grab bars - tub/shower;Grab bars - toilet Additional Comments: has access to [facility] and shower chair if  needed    Prior Function Level of Independence: Independent      Comments: Pt reports that until knee pain became too bad she was very active and independent. 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   PT Goals (current goals can now be found in the care plan section) Acute Rehab PT Goals Patient Stated Goal: go home tomorrow Progress towards PT goals: Progressing toward goals    Frequency    BID      PT Plan Current plan remains appropriate    Co-evaluation              AM-PAC PT "6 Clicks" Mobility   Outcome Measure  Help needed turning from your back to your side while in a flat bed without using bedrails?: None Help needed moving from lying on your back to sitting on the side of a flat bed without using bedrails?: None Help needed moving to and from a bed to a chair (including a wheelchair)?: None Help needed standing up from a chair using your arms (e.g., wheelchair or bedside chair)?: None Help needed to walk in hospital room?: None Help needed climbing 3-5 steps with a railing? : A Little 6 Click Score: 23    End of Session   Activity Tolerance: Patient limited by pain;Patient limited by fatigue Patient left: in bed;with call bell/phone within reach;with family/visitor present;with SCD's reapplied;Other (comment)(polar care; declined alarm, but will call)   PT Visit Diagnosis: Muscle weakness (generalized) (M62.81);Difficulty in walking, not elsewhere classified (R26.2);Pain Pain - Right/Left: Left Pain - part of body: Knee     Time: 1440-1503 PT Time Calculation (min) (ACUTE ONLY): 23 min  Charges:  $Therapeutic Exercise: 23-37 mins                      Scot Dock, PTA 12/07/2018, 3:17 PM

## 2018-12-08 MED ORDER — ENOXAPARIN SODIUM 40 MG/0.4ML ~~LOC~~ SOLN: 40.0000 mg | SUBCUTANEOUS | 0 refills | Status: DC

## 2018-12-08 MED ORDER — OXYCODONE HCL 5 MG PO TABS: 5.0000 mg | ORAL_TABLET | ORAL | 0 refills | Status: DC | PRN

## 2018-12-08 MED ORDER — CELECOXIB 200 MG PO CAPS: 200.0000 mg | ORAL_CAPSULE | Freq: Two times a day (BID) | ORAL | 1 refills | Status: DC

## 2018-12-08 MED ORDER — TRAMADOL HCL 50 MG PO TABS: 50.0000 mg | ORAL_TABLET | ORAL | 1 refills | Status: DC | PRN

## 2018-12-08 NOTE — Discharge Summary (Signed)
Physician Discharge Summary  Subjective: 2 Days Post-Op Procedure(s) (LRB): TOTAL KNEE ARTHROPLASTY (Left) Patient reports pain as mild.   Patient seen in rounds with Dr. Ernest Pine. Patient is well, and has had no acute complaints or problems Patient is ready to go home with outpatient physical therapy  Physician Discharge Summary  Patient ID: Alexandria [name] MRN: [id number] DOB/AGE: 1971/09/24 48 y.o.  Admit date: 12/06/2018 Discharge date: 12/08/2018  Admission Diagnoses:  Discharge Diagnoses:  Active Problems:   Total knee replacement status   Discharged Condition: good  Hospital Course: The patient is postop day 2 from a left total knee replacement.  The patient has done very well since surgery.  She is ambulating 200 feet with physical therapy.  She has 90 degrees of flexion.  Her drain was removed today with no complication.  Her vitals have remained stable with slightly elevated blood pressure.  Her pain is under control.  She has had a bowel movement.  She is ready to go home with outpatient physical therapy today.  Treatments: surgery:   Left total knee arthroplasty using computer-assisted navigation  SURGEON:  Jena Gauss. M.D.  ASSISTANT: Benancio Deeds, PA-C (present and scrubbed throughout the case, critical for assistance with exposure, retraction, instrumentation, and closure)  ANESTHESIA: general  ESTIMATED BLOOD LOSS: 50 mL  FLUIDS REPLACED: 1400 mL of crystalloid  TOURNIQUET TIME: 104 minutes  DRAINS: 2 medium Hemovac drains  SOFT TISSUE RELEASES: Anterior cruciate ligament, posterior cruciate ligament, deep medial collateral ligament, patellofemoral ligament  IMPLANTS UTILIZED: DePuy Attune size 2 posterior stabilized femoral component (cemented), size 3 rotating platform tibial component (cemented), 32 mm medialized dome patella (cemented), and a 8 mm stabilized rotating platform polyethylene insert.  Discharge Exam: Blood pressure  (!) 160/76, pulse 94, temperature 97.6 F (36.4 C), temperature source Oral, resp. rate 18, height  (1.6 m), weight 85.3 kg, SpO2 97 %.   Disposition:    Allergies as of 12/08/2018      Reactions   Darvon [propoxyphene] Nausea And Vomiting   Darvocet   Doxycycline Nausea And Vomiting   Penicillins Other (See Comments)   Did it involve swelling of the face/tongue/throat, SOB, or low BP? Unknown Did it involve sudden or severe rash/hives, skin peeling, or any reaction on the inside of your mouth or nose? Unknown Did you need to seek medical attention at a hospital or doctor's office? Yes When did it last happen? Childhood reaction  If all above answers are "NO", may proceed with cephalosporin use.   Sulfa Antibiotics Swelling, Other (See Comments)   Almost swelled throat shut      Medication List    TAKE these medications   amLODipine 10 MG tablet Commonly known as:  NORVASC Take 1 tablet (10 mg total) by mouth daily.   CAMRESE 0.15-0.03 &0.01 MG tablet Generic drug:  Levonorgestrel-Ethinyl Estradiol Take 1 tablet by mouth at bedtime.   celecoxib 200 MG capsule Commonly known as:  CELEBREX Take 1 capsule (200 mg total) by mouth 2 (two) times daily.   cetirizine 10 MG tablet Commonly known as:  ZYRTEC Take 1 tablet (10 mg total) by mouth daily. What changed:  when to take this   enoxaparin 40 MG/0.4ML injection Commonly known as:  LOVENOX Inject 0.4 mLs (40 mg total) into the skin daily.   Fluocinolone Acetonide 0.01 % Oil Place 2 drops in ear(s) 2 (two) times daily. What changed:    when to take this  reasons to take this  fluticasone 50 MCG/ACT nasal spray Commonly known as:  FLONASE Place 2 sprays into both nostrils daily. What changed:    when to take this  reasons to take this   oxyCODONE 5 MG immediate release tablet Commonly known as:  Oxy IR/ROXICODONE Take 1 tablet (5 mg total) by mouth every 4 (four) hours as needed for moderate pain  (pain score 4-6).   rizatriptan 10 MG tablet Commonly known as:  MAXALT TAKE 1 TABLET BY MOUTH AS NEEDED FOR MIGRAINE, MAY REPEAT IN 2 HOURS IF HEADACHE PERSISTS. What changed:  See the new instructions.   SUMAtriptan 50 MG tablet Commonly known as:  IMITREX TAKE 1 TABLET BY MOUTH AS NEEDED FOR HEADACHE, MAY REPEAT IN 2 HOURS IF HEADACHE PERSISTS OR RECURS. What changed:  See the new instructions.   traMADol 50 MG tablet Commonly known as:  ULTRAM Take 1-2 tablets (50-100 mg total) by mouth every 4 (four) hours as needed for moderate pain. What changed:    how much to take  when to take this  reasons to take this            Durable Medical Equipment  (From admission, onward)         Start     Ordered   12/06/18 1711  DME Walker rolling  Once    Question:  Patient needs a walker to treat with the following condition  Answer:  Total knee replacement status   12/06/18 1710   12/06/18 1711  DME Bedside commode  Once    Question:  Patient needs a bedside commode to treat with the following condition  Answer:  Total knee replacement status   12/06/18 1710         Follow-up Information    Tera Partridge, PA On 12/20/2018.   Specialty:  Physician Assistant Why:  at 1:15pm Contact information: [address] [place] [address] [phone]        Donato Heinz, MD On 01/16/2019.   Specialty:  Orthopedic Surgery Why:  at 2:00pm Contact information: [address] [facility] [place] [address] [phone]           Signed: [name], Alexandria [name] 12/08/2018, 7:28 AM   Objective: Vital signs in last 24 hours: Temp:  [97.6 F (36.4 C)-98.5 F (36.9 C)] 97.6 F (36.4 C) (02/27 2329) Pulse Rate:  [85-94] 94 (02/27 2329) Resp:  [18] 18 (02/27 2329) BP: (136-160)/(70-84) 160/76 (02/27 2329) SpO2:  [96 %-98 %] 97 % (02/27 2329)  Intake/Output from previous day:  Intake/Output Summary (Last 24 hours) at 12/08/2018 0728 Last data filed  at 12/07/2018 2330 Gross per 24 hour  Intake 240 ml  Output 200 ml  Net 40 ml    Intake/Output this shift: No intake/output data recorded.  Labs: No results for input(s): HGB in the last 72 hours. No results for input(s): WBC, RBC, HCT, PLT in the last 72 hours. No results for input(s): NA, K, CL, CO2, BUN, CREATININE, GLUCOSE, CALCIUM in the last 72 hours. No results for input(s): LABPT, INR in the last 72 hours.  EXAM: General - Patient is Alert and Oriented Extremity - Neurovascular intact Sensation intact distally Dorsiflexion/Plantar flexion intact No cellulitis present Compartment soft Incision - clean, dry, with Hemovac removed.  The tubing appeared to be intact on removal. Motor Function -plantarflexion and dorsiflexion intact.  Able to do a straight leg raise independently.  Assessment/Plan: 2 Days Post-Op Procedure(s) (LRB): TOTAL KNEE ARTHROPLASTY (Left) Procedure(s) (LRB):  TOTAL KNEE ARTHROPLASTY (Left) Past Medical History:  Diagnosis Date  . Hypertension   . Migraines    Active Problems:   Total knee replacement status  Estimated body mass index is 33.31 kg/m as calculated from the following:   Height as of this encounter: 5\' 3"  (1.6 m).   Weight as of this encounter: 85.3 kg. Advance diet Up with therapy  Discharge home with outpatient physical therapy Diet - Regular diet Follow up - in 2 weeks Activity - WBAT Disposition - Home Condition Upon Discharge - Good DVT Prophylaxis - Lovenox and TED hose  Dedra Skeens, PA-C Orthopaedic Surgery 12/08/2018, 7:28 AM

## 2018-12-08 NOTE — Progress Notes (Signed)
  Subjective: 2 Days Post-Op Procedure(s) (LRB): TOTAL KNEE ARTHROPLASTY (Left) Patient reports pain as mild to moderate.   Patient seen in rounds with Dr. Ernest Pine. Patient is well, and has had no acute complaints or problems Plan is to go Home after hospital stay. Negative for chest pain and shortness of breath Fever: no Gastrointestinal: Negative for nausea and vomiting  Objective: Vital signs in last 24 hours: Temp:  [97.6 F (36.4 C)-98.5 F (36.9 C)] 97.6 F (36.4 C) (02/27 2329) Pulse Rate:  [85-94] 94 (02/27 2329) Resp:  [18] 18 (02/27 2329) BP: (136-160)/(70-84) 160/76 (02/27 2329) SpO2:  [96 %-98 %] 97 % (02/27 2329)  Intake/Output from previous day:  Intake/Output Summary (Last 24 hours) at 12/08/2018 0723 Last data filed at 12/07/2018 2330 Gross per 24 hour  Intake 240 ml  Output 200 ml  Net 40 ml    Intake/Output this shift: No intake/output data recorded.  Labs: No results for input(s): HGB in the last 72 hours. No results for input(s): WBC, RBC, HCT, PLT in the last 72 hours. No results for input(s): NA, K, CL, CO2, BUN, CREATININE, GLUCOSE, CALCIUM in the last 72 hours. No results for input(s): LABPT, INR in the last 72 hours.   EXAM General - Patient is Alert and Oriented Extremity - Sensation intact distally Dorsiflexion/Plantar flexion intact Compartment soft Dressing/Incision - clean, dry, with Hemovac removed.  The tube endings appear to be intact on removal. Motor Function - intact, moving foot and toes well on exam.  Able to straight leg raise independently.  The patient ambulated 200 feet with physical therapy  Past Medical History:  Diagnosis Date  . Hypertension   . Migraines     Assessment/Plan: 2 Days Post-Op Procedure(s) (LRB): TOTAL KNEE ARTHROPLASTY (Left) Active Problems:   Total knee replacement status  Estimated body mass index is 33.31 kg/m as calculated from the following:   Height as of this encounter: 5\' 3"  (1.6 m).  Weight as of this encounter: 85.3 kg. Increase diet. Continue physical therapy this morning. Discharge home with outpatient physical therapy today.  DVT Prophylaxis - Lovenox, Foot Pumps and TED hose Weight-Bearing as tolerated to left leg  Dedra Skeens, PA-C Orthopaedic Surgery 12/08/2018, 7:23 AM

## 2018-12-08 NOTE — Progress Notes (Signed)
Physical Therapy Treatment Patient Details Name: Alexandria [name] MRN: [id number] DOB: 1971/05/23 Today's Date: 12/08/2018    History of Present Illness 48 y/o female s/p L TKA 12/06/18.    PT Comments    Pt ready for session.  Out of bed and completed exercises, lap around unit and stairs without difficulty.  Awaiting discharge.  Follow Up Recommendations  Home health PT;Follow surgeon's recommendation for DC plan and follow-up therapies     Equipment Recommendations  None recommended by PT    Recommendations for Other Services       Precautions / Restrictions Precautions Precautions: Fall Restrictions Weight Bearing Restrictions: Yes LLE Weight Bearing: Weight bearing as tolerated    Mobility  Bed Mobility Overal bed mobility: Independent                Transfers Overall transfer level: Modified independent                  Ambulation/Gait Ambulation/Gait assistance: Modified independent (Device/Increase time) Gait Distance (Feet): 200 Feet Assistive device: Rolling walker (2 wheeled) Gait Pattern/deviations: Step-through pattern         Stairs Stairs: Yes Stairs assistance: Modified independent (Device/Increase time) Stair Management: Two rails;Step to pattern Number of Stairs: 4 General stair comments: with ease   Wheelchair Mobility    Modified Rankin (Stroke Patients Only)       Balance Overall balance assessment: Independent                                          Cognition Arousal/Alertness: Awake/alert Behavior During Therapy: WFL for tasks assessed/performed Overall Cognitive Status: Within Functional Limits for tasks assessed                                        Exercises Total Joint Exercises Ankle Circles/Pumps: AROM;Both;20 reps Quad Sets: Strengthening;Left;20 reps Heel Slides: Strengthening;10 reps Hip ABduction/ADduction: Strengthening;Left;20 reps;Supine;Other  (comment) Straight Leg Raises: Strengthening;Left;10 reps;Supine Long Arc Quad: Strengthening;Left;10 reps;Seated Knee Flexion: AROM;Left;10 reps;Seated Goniometric ROM: 0-102    General Comments        Pertinent Vitals/Pain Pain Assessment: Faces Faces Pain Scale: Hurts little more Pain Location: L knee Pain Descriptors / Indicators: Aching Pain Intervention(s): Monitored during session;Patient requesting pain meds-RN notified    Home Living                      Prior Function            PT Goals (current goals can now be found in the care plan section) Progress towards PT goals: Progressing toward goals    Frequency    BID      PT Plan Current plan remains appropriate    Co-evaluation              AM-PAC PT "6 Clicks" Mobility   Outcome Measure  Help needed turning from your back to your side while in a flat bed without using bedrails?: None Help needed moving from lying on your back to sitting on the side of a flat bed without using bedrails?: None Help needed moving to and from a bed to a chair (including a wheelchair)?: None Help needed standing up from a chair using your arms (e.g., wheelchair or bedside chair)?: None Help needed to  walk in hospital room?: None Help needed climbing 3-5 steps with a railing? : None 6 Click Score: 24    End of Session Equipment Utilized During Treatment: Gait belt Activity Tolerance: Patient tolerated treatment well Patient left: in bed;with bed alarm set;with family/visitor present;with call bell/phone within reach Nurse Communication: Mobility status Pain - Right/Left: Left Pain - part of body: Knee     Time: 5809-9833 PT Time Calculation (min) (ACUTE ONLY): 13 min  Charges:  $Gait Training: 8-22 mins                     Danielle Dess, PTA 12/08/18, 10:05 AM

## 2018-12-16 NOTE — Anesthesia Postprocedure Evaluation (Signed)
Anesthesia Post Note  Patient: Alexandria [name]  Procedure(s) Performed: TOTAL KNEE ARTHROPLASTY (Left Knee)  Patient location during evaluation: PACU Anesthesia Type: General Level of consciousness: awake and alert Pain management: pain level controlled Vital Signs Assessment: post-procedure vital signs reviewed and stable Respiratory status: spontaneous breathing, nonlabored ventilation, respiratory function stable and patient connected to nasal cannula oxygen Cardiovascular status: blood pressure returned to baseline and stable Postop Assessment: no apparent nausea or vomiting Anesthetic complications: no     Last Vitals:  Vitals:   12/07/18 2329 12/08/18 0752  BP: (!) 160/76 (!) 154/85  Pulse: 94 81  Resp: 18 17  Temp: 36.4 C 36.8 C  SpO2: 97% 97%    Last Pain:  Vitals:   12/08/18 1000  TempSrc:   PainSc: 8                  Yevette Edwards

## 2019-01-23 ENCOUNTER — Other Ambulatory Visit: Payer: Self-pay

## 2019-01-23 ENCOUNTER — Ambulatory Visit: Payer: Self-pay

## 2019-01-23 ENCOUNTER — Ambulatory Visit (INDEPENDENT_AMBULATORY_CARE_PROVIDER_SITE_OTHER): Payer: Managed Care, Other (non HMO) | Admitting: Family Medicine

## 2019-01-23 DIAGNOSIS — R42 Dizziness and giddiness: Secondary | ICD-10-CM

## 2019-01-23 DIAGNOSIS — R112 Nausea with vomiting, unspecified: Secondary | ICD-10-CM

## 2019-01-23 MED ORDER — ONDANSETRON 4 MG PO TBDP: 4.0000 mg | ORAL_TABLET | Freq: Three times a day (TID) | ORAL | 1 refills | Status: DC | PRN

## 2019-01-23 MED ORDER — MECLIZINE HCL 25 MG PO TABS: 25.0000 mg | ORAL_TABLET | Freq: Three times a day (TID) | ORAL | 1 refills | Status: DC | PRN

## 2019-01-23 NOTE — Telephone Encounter (Signed)
Pt c/o lightheadedness and vertigo that started this morning. She had some nausea and vomited x 1. Pt stated standing and making sudden movements exacerbate her symptoms.  Pt was evaluated and treated last year for same symptoms. Pt stated she took medication and it went away. Pt stated she is having head congestion.Pt also c/o mild headache and sinus drainage. Care advice given and pt verbalized understanding. No answer at [facility]. Sending triage note high priority.       Reason for Disposition . [1] MILD dizziness (e.g., vertigo; walking normally) AND [2] has been evaluated by physician for this  Answer Assessment - Initial Assessment Questions 1. DESCRIPTION: "Describe your dizziness."    Dizzy when walking or standing 2. VERTIGO: "Do you feel like either you or the room is spinning or tilting?"      Yes this am 3. LIGHTHEADED: "Do you feel lightheaded?" (e.g., somewhat faint, woozy, weak upon standing)     yes 4. SEVERITY: "How bad is it?"  "Can you walk?"   - MILD - Feels unsteady but walking normally.   - MODERATE - Feels very unsteady when walking, but not falling; interferes with normal activities (e.g., school, work) .   - SEVERE - Unable to walk without falling (requires assistance).     mild 5. ONSET:  "When did the dizziness begin?"     This morning 6. AGGRAVATING FACTORS: "Does anything make it worse?" (e.g., standing, change in head position)     Standing, sudden movements, 7. CAUSE: "What do you think is causing the dizziness?"     no 8. RECURRENT SYMPTOM: "Have you had dizziness before?" If so, ask: "When was the last time?" "What happened that time?"     Yes-1 year ago- went to doctor nausea meclizine which helped 9. OTHER SYMPTOMS: "Do you have any other symptoms?" (e.g., headache, weakness, numbness, vomiting, earache)     Nausea and vomiting, mild headache, sinus drainage 10. PREGNANCY: "Is there any chance you are pregnant?" "When was your last  menstrual period?"       N/a LMP: 1 month ago  Protocols used: DIZZINESS - VERTIGO-A-AH

## 2019-01-23 NOTE — Telephone Encounter (Signed)
She needs a virtual visit. Please offer her an appointment with Lauren today.

## 2019-01-23 NOTE — Progress Notes (Signed)
Patient ID: Alexandria SierrasDebbie J [name], female   DOB: 01-Jun-1971, 48 y.o.   MRN: [id number]  Virtual Visit via video Note  This visit type was conducted due to national recommendations for restrictions regarding the COVID-19 pandemic (e.g. social distancing).  This format is felt to be most appropriate for this patient at this time.  All issues noted in this document were discussed and addressed.  No physical exam was performed (except for noted visual exam findings with Video Visits).   I connected with Alexandria [name] on 01/24/19 at  3:20 PM EDT by a video enabled telemedicine application and verified that I am speaking with the correct person using two identifiers. Location patient: home Location provider: [facility] Persons participating in the virtual visit: patient, provider  I discussed the limitations, risks, security and privacy concerns of performing an evaluation and management service by video and the availability of in person appointments. I also discussed with the patient that there may be a patient responsible charge related to this service. The patient expressed understanding and agreed to proceed.   HPI:  Patient and I connected via video chat today due to complaints of dizziness and some nausea and vomiting.  Patient states she has had episodes of vertigo in the past where she has felt the room spinning which then induces nausea and some vomiting.  States her current symptoms are exactly as they have been in the past with episodes of vertigo.  Denies chest pain.  Denies shortness of breath or wheezing.  Denies fever or chills.  Denies body aches.  Denies loss of visual field, denies one-sided facial droop, denies one-sided extremity weakness, denies any speech difficulty or hearing loss.   ROS: See pertinent positives and negatives per HPI.  Past Medical History:  Diagnosis Date  . Hypertension   . Migraines     Past Surgical History:  Procedure Laterality Date  . KNEE  ARTHROSCOPY WITH MEDIAL MENISECTOMY Left 09/08/2017   Procedure: KNEE ARTHROSCOPY WITH PARTIAL MEDIAL MENISECTOMY;  Surgeon: Kennedy BuckerMenz, Michael, MD;  Location: [facility] ORS;  Service: Orthopedics;  Laterality: Left;  . SYNOVECTOMY Left 09/08/2017   Procedure: PartiaL SYNOVECTOMY;  Surgeon: Kennedy BuckerMenz, Michael, MD;  Location: [facility] ORS;  Service: Orthopedics;  Laterality: Left;  . TOTAL KNEE ARTHROPLASTY Left 12/06/2018   Procedure: TOTAL KNEE ARTHROPLASTY;  Surgeon: Donato HeinzHooten, James P, MD;  Location: [facility] ORS;  Service: Orthopedics;  Laterality: Left;    Family History  Problem Relation Age of Onset  . Hypertension Mother   . Hypertension Father     Social History   Tobacco Use  . Smoking status: Never Smoker  . Smokeless tobacco: Never Used  Substance Use Topics  . Alcohol use: Yes    Alcohol/week: 0.0 - 1.0 standard drinks     Current Outpatient Medications:  .  amLODipine (NORVASC) 10 MG tablet, Take 1 tablet (10 mg total) by mouth daily., Disp: 30 tablet, Rfl: 3 .  celecoxib (CELEBREX) 200 MG capsule, Take 1 capsule (200 mg total) by mouth 2 (two) times daily., Disp: 60 capsule, Rfl: 1 .  cetirizine (ZYRTEC) 10 MG tablet, Take 1 tablet (10 mg total) by mouth daily. (Patient taking differently: Take 10 mg by mouth at bedtime. ), Disp: 90 tablet, Rfl: 3 .  enoxaparin (LOVENOX) 40 MG/0.4ML injection, Inject 0.4 mLs (40 mg total) into the skin daily., Disp: 14 Syringe, Rfl: 0 .  Fluocinolone Acetonide 0.01 % OIL, Place 2 drops in ear(s) 2 (two) times daily. (Patient taking differently: Place 2  drops in ear(s) 2 (two) times daily as needed (for itchy ears due to eczema). ), Disp: 20 mL, Rfl: 6 .  fluticasone (FLONASE) 50 MCG/ACT nasal spray, Place 2 sprays into both nostrils daily. (Patient taking differently: Place 2 sprays into both nostrils daily as needed (allergies/nasal irritation.). ), Disp: 16 g, Rfl: 6 .  Levonorgestrel-Ethinyl Estradiol (CAMRESE) 0.15-0.03 &0.01 MG tablet, Take 1 tablet by mouth  at bedtime., Disp: , Rfl:  .  oxyCODONE (OXY IR/ROXICODONE) 5 MG immediate release tablet, Take 1 tablet (5 mg total) by mouth every 4 (four) hours as needed for moderate pain (pain [name] 4-6)., Disp: 30 tablet, Rfl: 0 .  rizatriptan (MAXALT) 10 MG tablet, TAKE 1 TABLET BY MOUTH AS NEEDED FOR MIGRAINE, MAY REPEAT IN 2 HOURS IF HEADACHE PERSISTS. (Patient taking differently: Take 10 mg by mouth every 2 (two) hours as needed for migraine. ), Disp: 10 tablet, Rfl: 6 .  SUMAtriptan (IMITREX) 50 MG tablet, TAKE 1 TABLET BY MOUTH AS NEEDED FOR HEADACHE, MAY REPEAT IN 2 HOURS IF HEADACHE PERSISTS OR RECURS. (Patient taking differently: Take 50 mg by mouth every 2 (two) hours as needed for migraine. ), Disp: 10 tablet, Rfl: 6 .  traMADol (ULTRAM) 50 MG tablet, Take 1-2 tablets (50-100 mg total) by mouth every 4 (four) hours as needed for moderate pain., Disp: 30 tablet, Rfl: 1 .  meclizine (ANTIVERT) 25 MG tablet, Take 1 tablet (25 mg total) by mouth 3 (three) times daily as needed for dizziness. Can cause drowsiness, Disp: 30 tablet, Rfl: 1 .  ondansetron (ZOFRAN-ODT) 4 MG disintegrating tablet, Take 1 tablet (4 mg total) by mouth every 8 (eight) hours as needed for nausea or vomiting., Disp: 30 tablet, Rfl: 1  EXAM:  GENERAL: alert, oriented, appears well and in no acute distress  HEENT: atraumatic, conjunttiva clear, no obvious abnormalities on inspection of external nose and ears  NECK: normal movements of the head and neck  LUNGS: on inspection no signs of respiratory distress, breathing rate appears normal, no obvious gross SOB, gasping or wheezing  CV: no obvious cyanosis  MS: moves all visible extremities without noticeable abnormality  PSYCH/NEURO: pleasant and cooperative, no obvious depression or anxiety, speech and thought processing grossly intact  ASSESSMENT AND PLAN:  Discussed the following assessment and plan:  Nausea and vomiting, intractability of vomiting not specified,  unspecified vomiting type - Plan: ondansetron (ZOFRAN-ODT) 4 MG disintegrating tablet  Dizziness - Plan: meclizine (ANTIVERT) 25 MG tablet  Patient will use Antivert as needed to help reduce dizziness.  Patient is aware the Antivert can cause drowsiness, so do not take this prior to driving.  Also discussed keeping self well-hydrated, starting with bland foods and clear liquids such as white rice, crackers, plain toast, chicken broth, Jell-O, ginger ale, Gatorade and water.  Patient also will use Zofran as needed for nausea and vomiting.  Discussed alarm symptoms that would warrant patient calling back to the office and or going to emergency room for evaluation such as neurological deficits clinic loss of visual field, speech difficulty, one-sided facial or extremity weakness, severe headache, dizziness that will not improve even with the use of Antivert.   I discussed the assessment and treatment plan with the patient. The patient was provided an opportunity to ask questions and all were answered. The patient agreed with the plan and demonstrated an understanding of the instructions.   The patient was advised to call back or seek an in-person evaluation if the symptoms worsen or  if the condition fails to improve as anticipated.   Jodelle Green, FNP

## 2019-01-24 ENCOUNTER — Encounter: Payer: Self-pay | Admitting: Family Medicine

## 2019-01-24 NOTE — Telephone Encounter (Signed)
Tried to reach patient on 01/23/19 late documentation and called today left voicemail could not reach patient.

## 2019-01-25 NOTE — Telephone Encounter (Signed)
Patient had an Evisit through my chart and she is now feeling much better. FYI

## 2019-03-07 ENCOUNTER — Other Ambulatory Visit: Payer: Self-pay | Admitting: Family Medicine

## 2019-03-07 DIAGNOSIS — I10 Essential (primary) hypertension: Secondary | ICD-10-CM

## 2019-05-09 ENCOUNTER — Other Ambulatory Visit: Payer: Self-pay | Admitting: Family Medicine

## 2019-05-09 DIAGNOSIS — R112 Nausea with vomiting, unspecified: Secondary | ICD-10-CM

## 2019-05-09 DIAGNOSIS — R42 Dizziness and giddiness: Secondary | ICD-10-CM

## 2019-05-10 ENCOUNTER — Telehealth: Payer: Self-pay

## 2019-05-10 NOTE — Telephone Encounter (Signed)
I am happy to refill this though please find out if she is taking the imitrex or the maxalt for her migraines then I will refill. Thanks.

## 2019-05-11 NOTE — Telephone Encounter (Signed)
Lmtcb and ask for nina.  Nina,cma

## 2019-05-14 MED ORDER — SUMATRIPTAN SUCCINATE 50 MG PO TABS: ORAL_TABLET | ORAL | 0 refills | Status: DC

## 2019-05-14 NOTE — Telephone Encounter (Signed)
imitrex sent to pharmacy. She needs to follow-up for her BP. Please get her scheduled. Please also see if she is still taking the oral contraceptive pill. Thanks.

## 2019-05-14 NOTE — Telephone Encounter (Signed)
Called and spoke with the patient and she states the Imitrex is fine she stated she took the Maxalt because the insurance would not pay but for 30 days and at one time her migraines were very frequent and not they are under  Control so the Imitrex is all she needs and it is to go to [facility].    Sharren Schnurr,cma

## 2019-05-15 NOTE — Telephone Encounter (Signed)
Lmtcb.  Nina,cma 

## 2019-05-17 NOTE — Telephone Encounter (Signed)
Mychart message sent to patient.

## 2019-07-03 ENCOUNTER — Other Ambulatory Visit: Payer: Self-pay | Admitting: Family Medicine

## 2019-07-03 DIAGNOSIS — I10 Essential (primary) hypertension: Secondary | ICD-10-CM

## 2019-07-04 ENCOUNTER — Other Ambulatory Visit: Payer: Self-pay | Admitting: Family Medicine

## 2019-07-05 NOTE — Telephone Encounter (Signed)
The patient has not followed up since February. She needs to have follow-up scheduled to receive a refill. Please call her to get this scheduled.

## 2019-07-12 ENCOUNTER — Other Ambulatory Visit: Payer: Self-pay

## 2019-07-13 ENCOUNTER — Ambulatory Visit (INDEPENDENT_AMBULATORY_CARE_PROVIDER_SITE_OTHER): Payer: Managed Care, Other (non HMO) | Admitting: Family Medicine

## 2019-07-13 ENCOUNTER — Encounter: Payer: Self-pay | Admitting: Adult Health

## 2019-07-13 ENCOUNTER — Ambulatory Visit: Payer: Managed Care, Other (non HMO) | Admitting: Adult Health

## 2019-07-13 ENCOUNTER — Encounter: Payer: Self-pay | Admitting: Family Medicine

## 2019-07-13 ENCOUNTER — Other Ambulatory Visit: Payer: Self-pay

## 2019-07-13 VITALS — BP 180/90 | HR 89 | Temp 97.3°F | Resp 16 | Ht 63.0 in | Wt 197.0 lb

## 2019-07-13 VITALS — BP 170/80 | HR 98 | Temp 97.7°F | Ht 63.0 in | Wt 197.6 lb

## 2019-07-13 DIAGNOSIS — M1731 Unilateral post-traumatic osteoarthritis, right knee: Secondary | ICD-10-CM | POA: Diagnosis not present

## 2019-07-13 DIAGNOSIS — Z3041 Encounter for surveillance of contraceptive pills: Secondary | ICD-10-CM

## 2019-07-13 DIAGNOSIS — I10 Essential (primary) hypertension: Secondary | ICD-10-CM | POA: Diagnosis not present

## 2019-07-13 DIAGNOSIS — G43109 Migraine with aura, not intractable, without status migrainosus: Secondary | ICD-10-CM | POA: Diagnosis not present

## 2019-07-13 DIAGNOSIS — R5382 Chronic fatigue, unspecified: Secondary | ICD-10-CM

## 2019-07-13 DIAGNOSIS — Z008 Encounter for other general examination: Secondary | ICD-10-CM

## 2019-07-13 DIAGNOSIS — R030 Elevated blood-pressure reading, without diagnosis of hypertension: Secondary | ICD-10-CM

## 2019-07-13 MED ORDER — NORETHINDRONE 0.35 MG PO TABS: 1.0000 | ORAL_TABLET | Freq: Every day | ORAL | 11 refills | Status: AC

## 2019-07-13 MED ORDER — AMLODIPINE BESYLATE 10 MG PO TABS: 10.0000 mg | ORAL_TABLET | Freq: Every day | ORAL | 1 refills | Status: DC

## 2019-07-13 MED ORDER — SUMATRIPTAN SUCCINATE 50 MG PO TABS: ORAL_TABLET | ORAL | 1 refills | Status: AC

## 2019-07-13 NOTE — Patient Instructions (Signed)
Nice to see you. I have refilled your medications. We will switch you to the progestin only pill for contraceptive pill.  You can finish your current pack of combined oral contraceptives and then start the progestin only pill the next day.  If you have a missed dose please follow the instructions below Missed dose: Take as soon as remembered. A back up method of contraception should be used for 48 hours if dose is taken ?3 hours late. It is very important that you take this at the exact same time every day.

## 2019-07-13 NOTE — Progress Notes (Signed)
Tommi Rumps, MD Phone: [phone]  Alexandria [name] is a 48 y.o. female who presents today for follow-up.  Hypertension: Typically 120s over 70s at home.  She notes it is high today because she has been on prednisone for her right knee.  No chest pain or shortness of breath.  Chronic left ankle edema that is unchanged.  Migraines: She notes these have been very infrequent.  She uses the Imitrex and that will resolve her symptoms.  She does not report any aura at this time.  Contraceptive management: She continues on combined OCPs.  She had several refills left on her prior prescription and has continued on those.  Knee pain: Right knee pain.  She has seen orthopedics for this and is going to have a knee replacement in December.  She is currently on steroids for 10-day course.  Decreased energy: This has occurred since her left knee was replaced.  She did start on B12 and vitamin D though has not noticed any difference.  No depression.  She does note some mild intermittent anxiety though nothing excessive.  No exertional symptoms.  She feels like she sleeps quite well and gets 8 hours of sleep nightly.  Some tiredness when she wakes up.  No napping.  She does not snore.  No apneic episodes.  Social History   Tobacco Use  Smoking Status Never Smoker  Smokeless Tobacco Never Used     ROS see history of present illness  Objective  Physical Exam Vitals:   07/13/19 1519  BP: (!) 170/80  Pulse: 98  Temp: 97.7 F (36.5 C)  SpO2: 99%    BP Readings from Last 3 Encounters:  07/13/19 (!) 170/80  07/13/19 (!) 180/90  12/08/18 (!) 154/85   Wt Readings from Last 3 Encounters:  07/13/19 197 lb 9.6 oz (89.6 kg)  07/13/19 197 lb (89.4 kg)  12/06/18 188 lb 0.8 oz (85.3 kg)    Physical Exam Constitutional:      General: She is not in acute distress.    Appearance: She is not diaphoretic.  Cardiovascular:     Rate and Rhythm: Normal rate and regular rhythm.     Heart sounds:  Normal heart sounds.  Pulmonary:     Effort: Pulmonary effort is normal.     Breath sounds: Normal breath sounds.  Musculoskeletal:     Right lower leg: No edema.     Left lower leg: No edema.  Lymphadenopathy:     Cervical: No cervical adenopathy.  Skin:    General: Skin is warm and dry.  Neurological:     Mental Status: She is alert.      Assessment/Plan: Please see individual problem list.  Osteoarthritis of right knee She will continue to see orthopedics.  Essential hypertension Well-controlled at home.  This was well controlled at the orthopedic office in September.  Elevated today related to prednisone.  She will continue her current medication and monitor at home.  Migraine Stable.  Continue as needed Imitrex.  Oral contraceptive use Discussed risk of combined OCPs with her history of migraines.  We will switch to the progestin only pill.  Discussed option of going ahead and starting this though she would need to have a urine pregnancy test and use backup contraception for the next 2 to 5 days versus starting the progestin only pill when she finishes her current pack of OCPs.  She opted to finish her current pack of OCPs and then start the progestin.  I discussed that  it is very important she takes the progestin only pill at the same time every day.  Advised on backup contraceptive method if she misses a dose and instructions were given in the AVS.  Chronic fatigue Undetermined cause.  Could be deconditioning related to her prior surgery.  We will check lab work as outlined below.   Orders Placed This Encounter  Procedures  . Comp Met (CMET)  . TSH  . B12  . CBC  . HgB A1c    Meds ordered this encounter  Medications  . norethindrone (ORTHO MICRONOR) 0.35 MG tablet    Sig: Take 1 tablet (0.35 mg total) by mouth daily.    Dispense:  1 Package    Refill:  11  . SUMAtriptan (IMITREX) 50 MG tablet    Sig: TAKE 1 TABLET BY MOUTH AS NEEDED FOR HEADACHE, MAY REPEAT  IN 2 HOURS IF HEADACHE PERSISTS OR RECURS.    Dispense:  10 tablet    Refill:  1  . amLODipine (NORVASC) 10 MG tablet    Sig: Take 1 tablet (10 mg total) by mouth daily.    Dispense:  90 tablet    Refill:  1     Tommi Rumps, MD [place]

## 2019-07-13 NOTE — Progress Notes (Signed)
Subjective:    Patient ID: Alexandria [name], female    DOB: 04-08-1971, 48 y.o.   MRN: [id number]  HPI 48 yo female in non acute distress here for biometric screening. Patient on Prednisone due to needing  Right knee replacement. 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  Blood pressure (!) 180/90, pulse 89, temperature (!) 97.3 F (36.3 C), temperature source Temporal, resp. rate 16, height 5\' 3"  (1.6 m), weight 197 lb (89.4 kg), last menstrual period 06/27/2019, SpO2 99 %.  Allergies  Allergen Reactions  . Sulfa Antibiotics Swelling, Other (See Comments) and Shortness Of Breath    Almost swelled throat shut Swelling of throat  . Penicillins Other (See Comments) and Hives    Did it involve swelling of the face/tongue/throat, SOB, or low BP? Unknown Did it involve sudden or severe rash/hives, skin peeling, or any reaction on the inside of your mouth or nose? Unknown Did you need to seek medical attention at a hospital or doctor's office? Yes When did it last happen? Childhood reaction  If all above answers are "NO", may proceed with cephalosporin use.  Did it involve swelling of the face/tongue/throat, SOB, or low BP? Unknown Did it involve sudden or severe rash/hives, skin peeling, or any reaction on the inside of your mouth or nose? Unknown Did you need to seek medical attention at a hospital or doctor's office? Yes When did it last happen? Childhood reaction  If all above answers are "NO", may proceed with cephalosporin use.  . Darvon [Propoxyphene] Nausea And Vomiting    Darvocet  . Doxycycline Nausea And Vomiting     Current Outpatient Medications:  .  traMADol (ULTRAM) 50 MG tablet, Take by mouth., Disp: , Rfl:  .  amLODipine (NORVASC) 10 MG tablet, TAKE 1 TABLET BY MOUTH ONCE DAILY, Disp: 30 tablet, Rfl: 3 .  celecoxib (CELEBREX) 200 MG capsule, Take by mouth., Disp: , Rfl:  .  cetirizine (ZYRTEC) 10 MG tablet, Take 1  tablet (10 mg total) by mouth daily. (Patient taking differently: Take 10 mg by mouth at bedtime. ), Disp: 90 tablet, Rfl: 3 .  Fluocinolone Acetonide 0.01 % OIL, Place 2 drops in ear(s) 2 (two) times daily. (Patient taking differently: Place 2 drops in ear(s) 2 (two) times daily as needed (for itchy ears due to eczema). ), Disp: 20 mL, Rfl: 6 .  Levonorgestrel-Ethinyl Estradiol (CAMRESE) 0.15-0.03 &0.01 MG tablet, Take 1 tablet by mouth at bedtime., Disp: , Rfl:  .  predniSONE (DELTASONE) 10 MG tablet, , Disp: , Rfl:  .  SUMAtriptan (IMITREX) 50 MG tablet, TAKE 1 TABLET BY MOUTH AS NEEDED FOR HEADACHE, MAY REPEAT IN 2 HOURS IF HEADACHE PERSISTS OR RECURS., Disp: 10 tablet, Rfl: 0   Past Medical History:  Diagnosis Date  . Hypertension   . Migraines    Past Surgical History:  Procedure Laterality Date  . KNEE ARTHROSCOPY WITH MEDIAL MENISECTOMY Left 09/08/2017   Procedure: KNEE ARTHROSCOPY WITH PARTIAL MEDIAL MENISECTOMY;  Surgeon: Kennedy BuckerMenz, Michael, MD;  Location: [facility];  Service: Orthopedics;  Laterality: Left;  . SYNOVECTOMY Left 09/08/2017   Procedure: PartiaL SYNOVECTOMY;  Surgeon: Kennedy BuckerMenz, Michael, MD;  Location: [facility];  Service: Orthopedics;  Laterality: Left;  . TOTAL KNEE ARTHROPLASTY Left 12/06/2018   Procedure: TOTAL KNEE ARTHROPLASTY;  Surgeon: Donato HeinzHooten, James P, MD;  Location: [facility];  Service: Orthopedics;  Laterality: Left;   Review of Systems  All other  systems reviewed and are negative.      Objective:   Physical Exam Vitals signs and nursing note reviewed.  Constitutional:      Appearance: Normal appearance. She is well-groomed. She is obese.  HENT:     Head: Normocephalic and atraumatic.     Jaw: There is normal jaw occlusion.     Right Ear: Hearing, ear canal and external ear normal. A middle ear effusion is present.     Left Ear: Hearing, tympanic membrane, ear canal and external ear normal.     Nose: Nose normal.     Mouth/Throat:     Lips: Pink.     Mouth:  Mucous membranes are moist.     Pharynx: Oropharynx is clear.  Eyes:     General: Vision grossly intact.     Extraocular Movements: Extraocular movements intact.     Conjunctiva/sclera: Conjunctivae normal.     Pupils: Pupils are equal, round, and reactive to light.     Funduscopic exam:    Right eye: Red reflex present.        Left eye: Red reflex present. Neck:     Musculoskeletal: Normal range of motion and neck supple.     Thyroid: No thyromegaly.     Trachea: Trachea normal.  Cardiovascular:     Rate and Rhythm: Normal rate and regular rhythm.     Pulses: Normal pulses.          Carotid pulses are 2+ on the right side and 2+ on the left side.      Radial pulses are 2+ on the right side and 2+ on the left side.       Posterior tibial pulses are 2+ on the right side and 2+ on the left side.     Heart sounds: Normal heart sounds.  Pulmonary:     Effort: Pulmonary effort is normal.     Breath sounds: Normal breath sounds.  Abdominal:     General: Abdomen is protuberant. Bowel sounds are normal.     Palpations: Abdomen is soft.     Tenderness: There is no right CVA tenderness or left CVA tenderness.  Musculoskeletal: Normal range of motion.     Right lower leg: No edema.     Left lower leg: No edema.  Lymphadenopathy:     Head:     Right side of head: No submental, submandibular, tonsillar, preauricular, posterior auricular or occipital adenopathy.     Left side of head: No submental, submandibular, tonsillar, preauricular, posterior auricular or occipital adenopathy.     Cervical: No cervical adenopathy.     Right cervical: No superficial, deep or posterior cervical adenopathy.    Left cervical: No superficial, deep or posterior cervical adenopathy.  Skin:    General: Skin is warm and dry.     Capillary Refill: Capillary refill takes less than 2 seconds.  Neurological:     General: No focal deficit present.     Mental Status: She is alert and oriented to person, place, and  time.     GCS: GCS eye subscore is 4. GCS verbal subscore is 5. GCS motor subscore is 6.     Cranial Nerves: Cranial nerves are intact.     Sensory: Sensation is intact.     Motor: Motor function is intact.     Coordination: Coordination is intact. Romberg sign negative.     Deep Tendon Reflexes:     Reflex Scores:      Patellar reflexes are  2+ on the right side and 2+ on the left side. Psychiatric:        Attention and Perception: Attention and perception normal.        Mood and Affect: Mood normal.        Speech: Speech normal.        Behavior: Behavior normal. Behavior is cooperative.        Thought Content: Thought content normal.        Cognition and Memory: Cognition and memory normal.        Judgment: Judgment normal.           Assessment & Plan:  Biometric screening Elevated BP most likely due to Prednisone, patient states she that her BP usually 138/70's. Orders Placed This Encounter  Procedures  . Glucose, random  . Lipid Panel With LDL/HDL Ratio  . VITAMIN D 25 Hydroxy (Vit-D Deficiency, Fractures)  return to clinic as needed. Patient verbalizes understanding and has no questions at discharge.

## 2019-07-14 DIAGNOSIS — R5382 Chronic fatigue, unspecified: Secondary | ICD-10-CM | POA: Insufficient documentation

## 2019-07-14 NOTE — Assessment & Plan Note (Signed)
She will continue to see orthopedics. 

## 2019-07-14 NOTE — Assessment & Plan Note (Signed)
Stable.  Continue as needed Imitrex.

## 2019-07-14 NOTE — Assessment & Plan Note (Signed)
Discussed risk of combined OCPs with her history of migraines.  We will switch to the progestin only pill.  Discussed option of going ahead and starting this though she would need to have a urine pregnancy test and use backup contraception for the next 2 to 5 days versus starting the progestin only pill when she finishes her current pack of OCPs.  She opted to finish her current pack of OCPs and then start the progestin.  I discussed that it is very important she takes the progestin only pill at the same time every day.  Advised on backup contraceptive method if she misses a dose and instructions were given in the AVS.

## 2019-07-14 NOTE — Assessment & Plan Note (Signed)
Undetermined cause.  Could be deconditioning related to her prior surgery.  We will check lab work as outlined below.

## 2019-07-14 NOTE — Assessment & Plan Note (Signed)
Well-controlled at home.  This was well controlled at the orthopedic office in September.  Elevated today related to prednisone.  She will continue her current medication and monitor at home.

## 2019-07-16 LAB — CBC
HCT: 43.7 % (ref 35.0–45.0)
Hemoglobin: 14.5 g/dL (ref 11.7–15.5)
MCH: 28.7 pg (ref 27.0–33.0)
MCHC: 33.2 g/dL (ref 32.0–36.0)
MCV: 86.4 fL (ref 80.0–100.0)
MPV: 10.8 fL (ref 7.5–12.5)
Platelets: 341 10*3/uL (ref 140–400)
RBC: 5.06 10*6/uL (ref 3.80–5.10)
RDW: 12.4 % (ref 11.0–15.0)
WBC: 10.6 10*3/uL (ref 3.8–10.8)

## 2019-07-16 LAB — LIPID PANEL WITH LDL/HDL RATIO
Cholesterol, Total: 237 mg/dL — ABNORMAL HIGH (ref 100–199)
HDL: 39 mg/dL — ABNORMAL LOW (ref >39)
LDL Chol Calc (NIH): 161 mg/dL — ABNORMAL HIGH (ref 0–99)
LDL/HDL Ratio: 4.1 ratio — ABNORMAL HIGH (ref 0.0–3.2)
Triglycerides: 200 mg/dL — ABNORMAL HIGH (ref 0–149)
VLDL Cholesterol Cal: 37 mg/dL (ref 5–40)

## 2019-07-16 LAB — HEMOGLOBIN A1C
Hgb A1c MFr Bld: 6 % of total Hgb — ABNORMAL HIGH (ref <5.7)
Mean Plasma Glucose: 126 (calc)
eAG (mmol/L): 7 (calc)

## 2019-07-16 LAB — VITAMIN B12

## 2019-07-16 LAB — GLUCOSE, RANDOM: Glucose: 132 mg/dL — ABNORMAL HIGH (ref 65–99)

## 2019-07-16 LAB — COMPREHENSIVE METABOLIC PANEL

## 2019-07-16 LAB — TSH

## 2019-07-16 LAB — VITAMIN D 25 HYDROXY (VIT D DEFICIENCY, FRACTURES): Vit D, 25-Hydroxy: 292 ng/mL — ABNORMAL HIGH (ref 30.0–100.0)

## 2019-07-17 NOTE — Progress Notes (Signed)
Please stop your Vitamin D now, your levels are very elevated. Glucose is elevated. Cholesterol is elevated.Labs forwarded to primary care for management - Leone Haven, MD. Follow up with Leone Haven, MD within 1 week by phone.

## 2019-07-19 ENCOUNTER — Other Ambulatory Visit: Payer: Self-pay | Admitting: Family Medicine

## 2019-07-19 DIAGNOSIS — R7989 Other specified abnormal findings of blood chemistry: Secondary | ICD-10-CM

## 2019-08-07 ENCOUNTER — Other Ambulatory Visit: Payer: Managed Care, Other (non HMO)

## 2019-08-12 IMAGING — DX DG KNEE 1-2V PORT*L*
2 series · 2 of 2 positions shown · non-contrast
Comparison: MRI 06/08/2017

CLINICAL DATA: Status post total left knee arthroplasty.

EXAM:
PORTABLE LEFT KNEE - 1-2 VIEW

[knee ap]
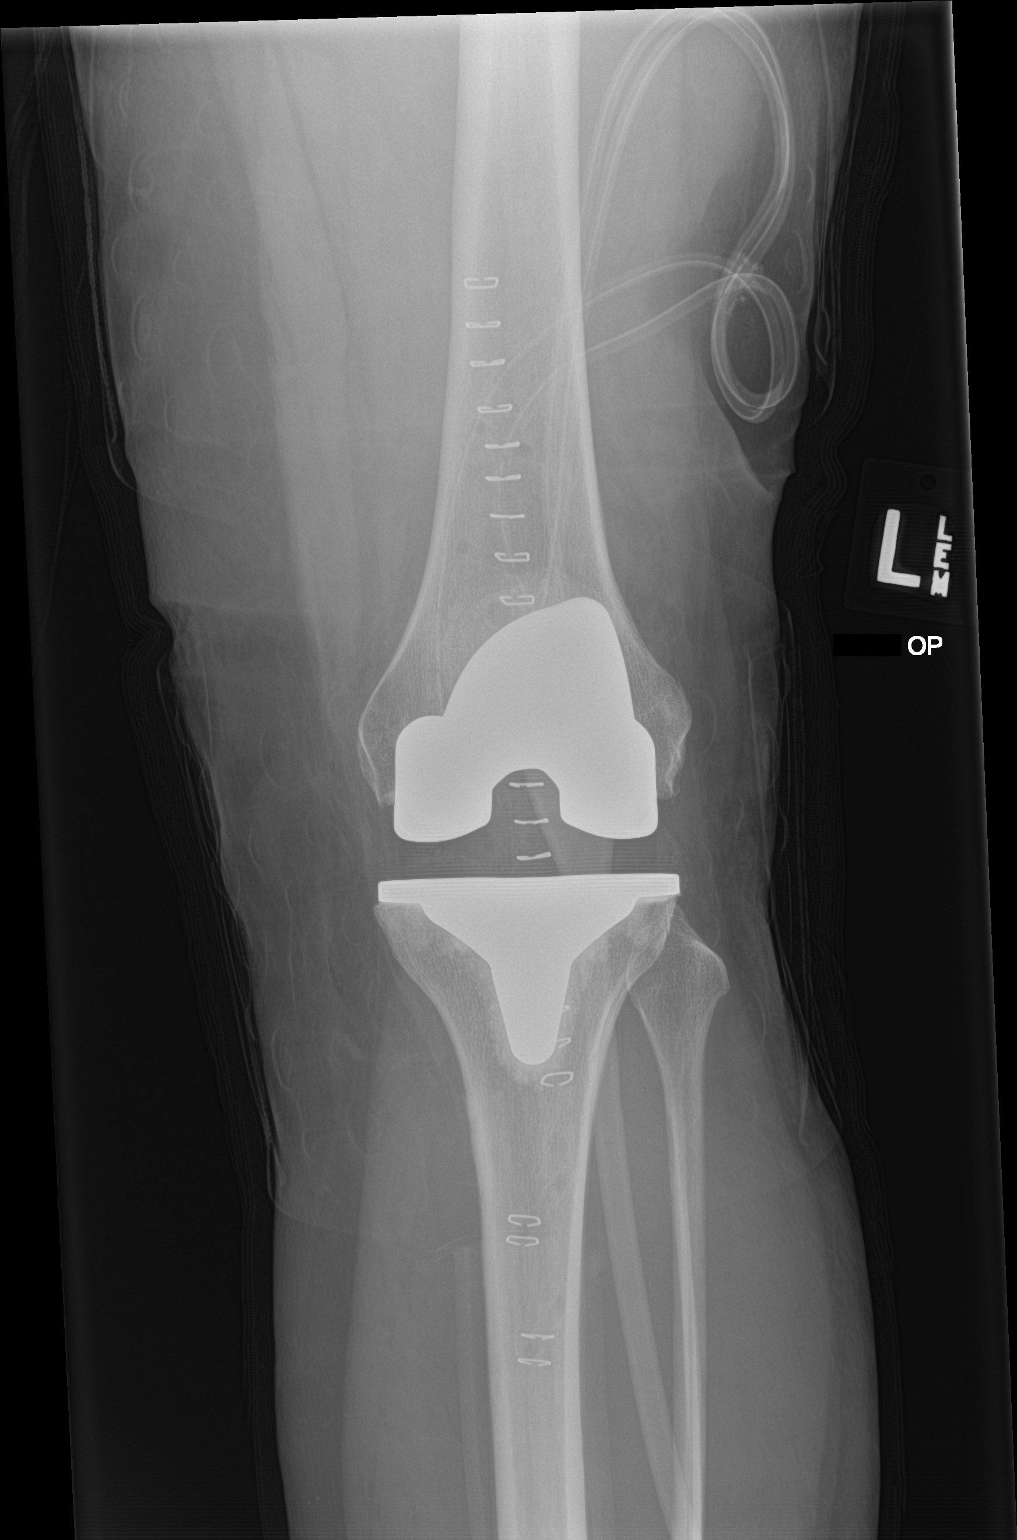

[knee lat]
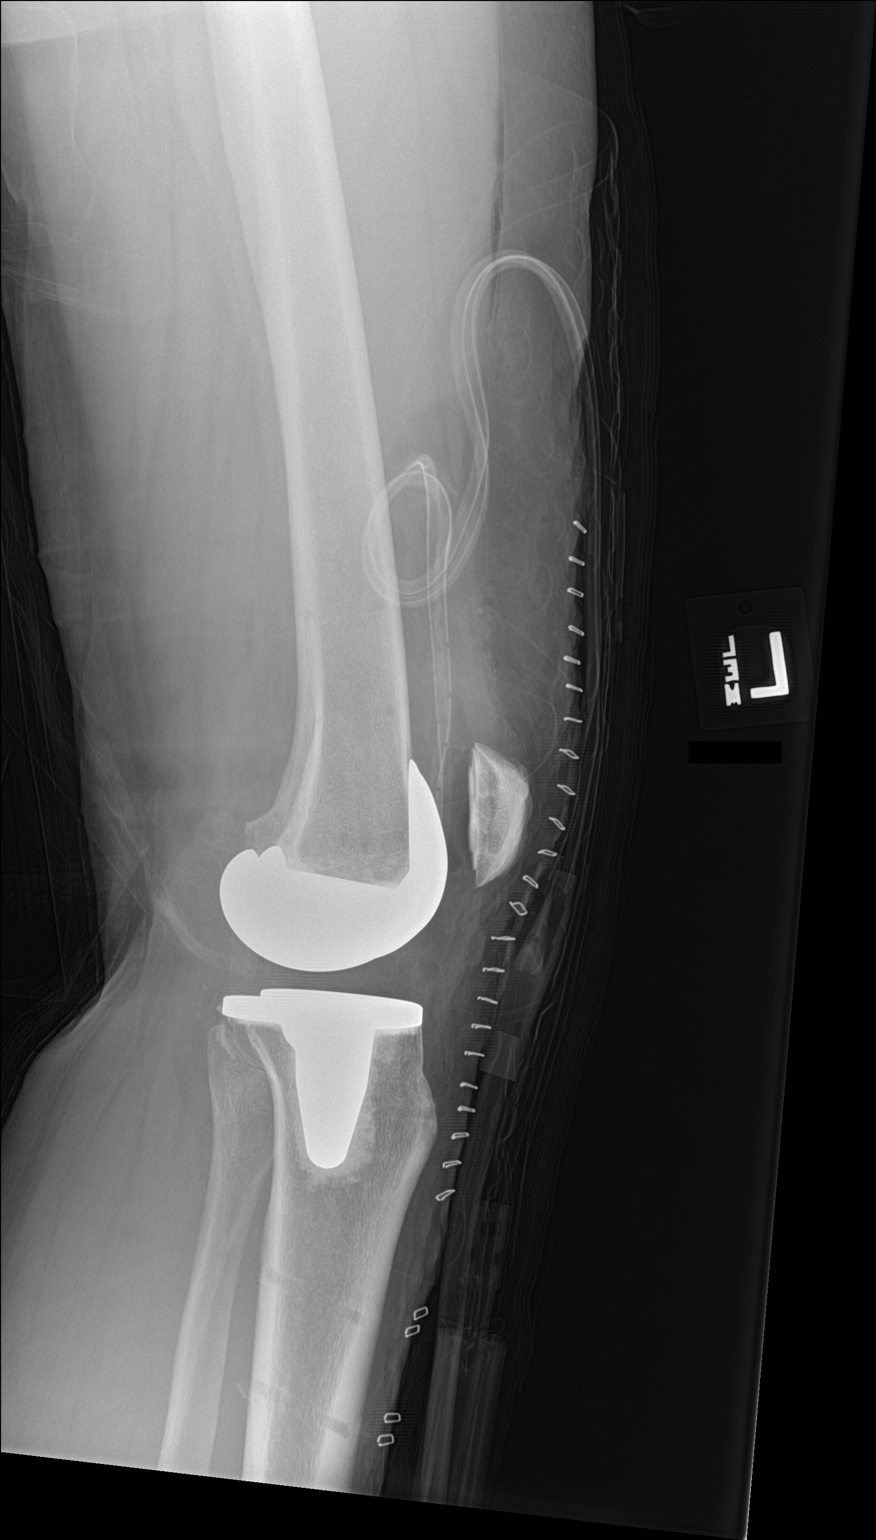

[2 of 2 positions shown; findings below may reference images not displayed]

FINDINGS: Well seated components of a total knee arthroplasty. No complicating
features are identified. Drill holes are noted in the tibia and
femur.
IMPRESSION: Well seated components of a total knee arthroplasty. No complicating
features.

## 2019-08-20 ENCOUNTER — Ambulatory Visit (INDEPENDENT_AMBULATORY_CARE_PROVIDER_SITE_OTHER): Payer: Managed Care, Other (non HMO) | Admitting: Family Medicine

## 2019-08-20 ENCOUNTER — Encounter: Payer: Self-pay | Admitting: Family Medicine

## 2019-08-20 VITALS — Ht 63.0 in | Wt 197.0 lb

## 2019-08-20 DIAGNOSIS — N390 Urinary tract infection, site not specified: Secondary | ICD-10-CM

## 2019-08-20 MED ORDER — NITROFURANTOIN MONOHYD MACRO 100 MG PO CAPS: 100.0000 mg | ORAL_CAPSULE | Freq: Two times a day (BID) | ORAL | 0 refills | Status: DC

## 2019-08-20 NOTE — Progress Notes (Signed)
Patient ID: Alexandria [name], female   DOB: 1971/01/01, 48 y.o.   MRN: [id number]    Virtual Visit via video Note  This visit type was conducted due to national recommendations for restrictions regarding the COVID-19 pandemic (e.g. social distancing).  This format is felt to be most appropriate for this patient at this time.  All issues noted in this document were discussed and addressed.  No physical exam was performed (except for noted visual exam findings with Video Visits).   I connected with Alexandria [name] today at 10:00 AM EST by a video enabled telemedicine application or telephone and verified that I am speaking with the correct person using two identifiers. Location patient: home Location provider: work or home office Persons participating in the virtual visit: patient, provider  I discussed the limitations, risks, security and privacy concerns of performing an evaluation and management service by video and the availability of in person appointments. I also discussed with the patient that there may be a patient responsible charge related to this service. The patient expressed understanding and agreed to proceed.   HPI:  Patient and I connected via video due to complaints of burning and pain with urination some slight fevers and some low back pain.  Patient concerned for possible UTI.  Denies any visible blood in urine.  Denies any vaginal discharge.  Denies concerns for STDs.  Denies vomiting or diarrhea.   ROS: See pertinent positives and negatives per HPI.  Past Medical History:  Diagnosis Date  . Hypertension   . Migraines     Past Surgical History:  Procedure Laterality Date  . KNEE ARTHROSCOPY WITH MEDIAL MENISECTOMY Left 09/08/2017   Procedure: KNEE ARTHROSCOPY WITH PARTIAL MEDIAL MENISECTOMY;  Surgeon: Kennedy Bucker, MD;  Location: [facility];  Service: Orthopedics;  Laterality: Left;  . SYNOVECTOMY Left 09/08/2017   Procedure: PartiaL SYNOVECTOMY;  Surgeon: Kennedy Bucker,  MD;  Location: [facility];  Service: Orthopedics;  Laterality: Left;  . TOTAL KNEE ARTHROPLASTY Left 12/06/2018   Procedure: TOTAL KNEE ARTHROPLASTY;  Surgeon: Donato Heinz, MD;  Location: [facility];  Service: Orthopedics;  Laterality: Left;    Family History  Problem Relation Age of Onset  . Hypertension Mother   . Hypertension Father    Social History   Tobacco Use  . Smoking status: Never Smoker  . Smokeless tobacco: Never Used  Substance Use Topics  . Alcohol use: Yes    Alcohol/week: 0.0 - 1.0 standard drinks    Current Outpatient Medications:  .  amLODipine (NORVASC) 10 MG tablet, Take 1 tablet (10 mg total) by mouth daily., Disp: 90 tablet, Rfl: 1 .  celecoxib (CELEBREX) 200 MG capsule, Take by mouth., Disp: , Rfl:  .  cetirizine (ZYRTEC) 10 MG tablet, Take 1 tablet (10 mg total) by mouth daily. (Patient taking differently: Take 10 mg by mouth at bedtime. ), Disp: 90 tablet, Rfl: 3 .  Fluocinolone Acetonide 0.01 % OIL, Place 2 drops in ear(s) 2 (two) times daily. (Patient taking differently: Place 2 drops in ear(s) 2 (two) times daily as needed (for itchy ears due to eczema). ), Disp: 20 mL, Rfl: 6 .  Levonorgestrel-Ethinyl Estradiol (CAMRESE) 0.15-0.03 &0.01 MG tablet, Take 1 tablet by mouth at bedtime., Disp: , Rfl:  .  norethindrone (ORTHO MICRONOR) 0.35 MG tablet, Take 1 tablet (0.35 mg total) by mouth daily., Disp: 1 Package, Rfl: 11 .  SUMAtriptan (IMITREX) 50 MG tablet, TAKE 1 TABLET BY MOUTH AS NEEDED FOR HEADACHE, MAY REPEAT IN  2 HOURS IF HEADACHE PERSISTS OR RECURS., Disp: 10 tablet, Rfl: 1 .  predniSONE (DELTASONE) 10 MG tablet, , Disp: , Rfl:   EXAM:  GENERAL: alert, oriented, appears well and in no acute distress  HEENT: atraumatic, conjunttiva clear, no obvious abnormalities on inspection of external nose and ears  NECK: normal movements of the head and neck  LUNGS: on inspection no signs of respiratory distress, breathing rate appears normal, no obvious  gross SOB, gasping or wheezing  CV: no obvious cyanosis  MS: moves all visible extremities without noticeable abnormality  PSYCH/NEURO: pleasant and cooperative, no obvious depression or anxiety, speech and thought processing grossly intact  ASSESSMENT AND PLAN:  Discussed the following assessment and plan:  UTI-patient symptoms do sound concerning for UTI.  We will treat her with Macrobid twice daily for 7 days.  Advised if symptoms do not resolve, we would then would require her to come in and drop off urine sample.  Encouraged to increase water intake, avoid excess caffeine and sugar beverages always start antibiotic and avoid holding urine.   I discussed the assessment and treatment plan with the patient. The patient was provided an opportunity to ask questions and all were answered. The patient agreed with the plan and demonstrated an understanding of the instructions.   The patient was advised to call back or seek an in-person evaluation if the symptoms worsen or if the condition fails to improve as anticipated.   Jodelle Green, FNP

## 2019-08-21 ENCOUNTER — Telehealth: Payer: Self-pay | Admitting: Family Medicine

## 2019-08-21 ENCOUNTER — Encounter: Payer: Self-pay | Admitting: Family Medicine

## 2019-08-21 DIAGNOSIS — R110 Nausea: Secondary | ICD-10-CM

## 2019-08-21 MED ORDER — CIPROFLOXACIN HCL 250 MG PO TABS: 250.0000 mg | ORAL_TABLET | Freq: Two times a day (BID) | ORAL | 0 refills | Status: DC

## 2019-08-21 MED ORDER — ONDANSETRON 4 MG PO TBDP: 4.0000 mg | ORAL_TABLET | Freq: Three times a day (TID) | ORAL | 0 refills | Status: DC | PRN

## 2019-08-21 NOTE — Telephone Encounter (Signed)
Pt following up on this request.  Pt states she also needs nausea medication as well.  Pt had to leave work and states this is urgent.  Pt states she was seen by Lauren yesterday.

## 2019-08-21 NOTE — Telephone Encounter (Signed)
Patient states nitrofurantoin, macrocrystal-monohydrate, (MACROBID) 100 MG capsule is making her feel naseau and would like PCP to prescribe cipro, please advise  Alexandria [name], [place]

## 2019-08-21 NOTE — Telephone Encounter (Signed)
Patient states nitrofurantoin, macrocrystal-monohydrate, (MACROBID) 100 MG capsule is making her feel naseau and would like PCP to prescribe cipro, please advise  [place], [place]

## 2019-08-21 NOTE — Addendum Note (Signed)
Addended by: Philis Nettle on: 08/21/2019 04:33 PM   Modules accepted: Orders

## 2019-08-21 NOTE — Telephone Encounter (Signed)
Patient notified & in route to pic up medication.

## 2019-08-26 NOTE — Discharge Instructions (Signed)
Instructions after Total Knee Replacement   Teddrick Mallari P. Kooper Chriswell, Jr., M.D.     Dept. of Orthopaedics & Sports Medicine  [facility]  [address]  [place], [place]  [address]  Phone: [phone]   Fax: [phone]    DIET: Drink plenty of non-alcoholic fluids. Resume your normal diet. Include foods high in fiber.  ACTIVITY:  You may use crutches or a walker with weight-bearing as tolerated, unless instructed otherwise. You may be weaned off of the walker or crutches by your Physical Therapist.  Do NOT place pillows under the knee. Anything placed under the knee could limit your ability to straighten the knee.   Continue doing gentle exercises. Exercising will reduce the pain and swelling, increase motion, and prevent muscle weakness.   Please continue to use the TED compression stockings for 6 weeks. You may remove the stockings at night, but should reapply them in the morning. Do not drive or operate any equipment until instructed.  WOUND CARE:  Continue to use the PolarCare or ice packs periodically to reduce pain and swelling. You may bathe or shower after the staples are removed at the first office visit following surgery.  MEDICATIONS: You may resume your regular medications. Please take the pain medication as prescribed on the medication. Do not take pain medication on an empty stomach. You have been given a prescription for a blood thinner (Lovenox or Coumadin). Please take the medication as instructed. (NOTE: After completing a 2 week course of Lovenox, take one Enteric-coated aspirin once a day. This along with elevation will help reduce the possibility of phlebitis in your operated leg.) Do not drive or drink alcoholic beverages when taking pain medications.  CALL THE OFFICE FOR: Temperature above 101 degrees Excessive bleeding or drainage on the dressing. Excessive swelling, coldness, or paleness of the toes. Persistent nausea and vomiting.  FOLLOW-UP:  You  should have an appointment to return to the office in 10-14 days after surgery. Arrangements have been made for continuation of Physical Therapy (either home therapy or outpatient therapy).   [facility] Department Directory         [email or web address]       [email or web address]          Cardiology  Appointments: [place] - [phone] [place] - [phone]  Endocrinology  Appointments: [place] - [phone] [place] - [phone]  Gastroenterology  Appointments: [place] - [phone] [place] - [phone]        General Surgery   Appointments: [place] - [phone]  Internal Medicine/Family Medicine  Appointments: [place] - [phone] [place] - [phone] [place] - [phone]  Metabolic and Weigh Loss Surgery  Appointments: [place] - [phone]        Neurology  Appointments: [place] - [phone] [place] - [phone]  Neurosurgery  Appointments: [place] - [phone]  Obstetrics & Gynecology  Appointments: [place] - [phone] [place] - [phone]        Pediatrics  Appointments: [place] - [phone] [place] - [phone]  Physiatry  Appointments: [place] -[phone]  Physical Therapy  Appointments: [place] - [phone] [place] - [phone]        Podiatry  Appointments: [place] - [phone] [place] - [phone]  Pulmonology  Appointments: [place] - [phone]  Rheumatology  Appointments: [place] - [phone]        [place] Location: [facility]  [address] , [place]  [address]  [place] Location: [facility] [address] [place], [place]  [address]  [place] Location: [facility] [address] [place], [place]  [address]    

## 2019-09-05 ENCOUNTER — Encounter
Admission: RE | Admit: 2019-09-05 | Discharge: 2019-09-05 | Disposition: A | Discharge status: Home / Self Care | Payer: Managed Care, Other (non HMO) | Source: Ambulatory Visit | Attending: Orthopedic Surgery | Admitting: Orthopedic Surgery

## 2019-09-05 ENCOUNTER — Other Ambulatory Visit: Payer: Self-pay

## 2019-09-05 DIAGNOSIS — R9431 Abnormal electrocardiogram [ECG] [EKG]: Secondary | ICD-10-CM | POA: Diagnosis not present

## 2019-09-05 DIAGNOSIS — R000 Tachycardia, unspecified: Secondary | ICD-10-CM | POA: Insufficient documentation

## 2019-09-05 DIAGNOSIS — Z01818 Encounter for other preprocedural examination: Secondary | ICD-10-CM | POA: Insufficient documentation

## 2019-09-05 DIAGNOSIS — Z01810 Encounter for preprocedural cardiovascular examination: Secondary | ICD-10-CM | POA: Diagnosis not present

## 2019-09-05 HISTORY — DX: Unspecified osteoarthritis, unspecified site: M19.90

## 2019-09-05 LAB — URINALYSIS, ROUTINE W REFLEX MICROSCOPIC
Bilirubin Urine: NEGATIVE
Glucose, UA: NEGATIVE mg/dL
Ketones, ur: NEGATIVE mg/dL
Nitrite: POSITIVE — AB
Protein, ur: 100 mg/dL — AB
RBC / HPF: >50 RBC/hpf — ABNORMAL HIGH (ref 0–5)
Specific Gravity, Urine: 1.019 (ref 1.005–1.030)
WBC, UA: >50 WBC/hpf — ABNORMAL HIGH (ref 0–5)
pH: 5 (ref 5.0–8.0)

## 2019-09-05 LAB — COMPREHENSIVE METABOLIC PANEL
ALT: 17 U/L (ref 0–44)
AST: 21 U/L (ref 15–41)
Albumin: 3.8 g/dL (ref 3.5–5.0)
Alkaline Phosphatase: 96 U/L (ref 38–126)
Anion gap: 11 (ref 5–15)
BUN: 11 mg/dL (ref 6–20)
CO2: 21 mmol/L — ABNORMAL LOW (ref 22–32)
Calcium: 9.4 mg/dL (ref 8.9–10.3)
Chloride: 105 mmol/L (ref 98–111)
Creatinine, Ser: 1.01 mg/dL — ABNORMAL HIGH (ref 0.44–1.00)
GFR calc Af Amer: >60 mL/min (ref >60)
GFR calc non Af Amer: >60 mL/min (ref >60)
Glucose, Bld: 110 mg/dL — ABNORMAL HIGH (ref 70–99)
Potassium: 3.7 mmol/L (ref 3.5–5.1)
Sodium: 137 mmol/L (ref 135–145)
Total Bilirubin: 0.6 mg/dL (ref 0.3–1.2)
Total Protein: 8.3 g/dL — ABNORMAL HIGH (ref 6.5–8.1)

## 2019-09-05 LAB — TYPE AND SCREEN
ABO/RH(D): O POS
Antibody Screen: NEGATIVE

## 2019-09-05 LAB — CBC
HCT: 36.8 % (ref 36.0–46.0)
Hemoglobin: 12.1 g/dL (ref 12.0–15.0)
MCH: 28.5 pg (ref 26.0–34.0)
MCHC: 32.9 g/dL (ref 30.0–36.0)
MCV: 86.6 fL (ref 80.0–100.0)
Platelets: 425 10*3/uL — ABNORMAL HIGH (ref 150–400)
RBC: 4.25 MIL/uL (ref 3.87–5.11)
RDW: 12.5 % (ref 11.5–15.5)
WBC: 8.1 10*3/uL (ref 4.0–10.5)
nRBC: 0 % (ref 0.0–0.2)

## 2019-09-05 LAB — C-REACTIVE PROTEIN: CRP: 2.2 mg/dL — ABNORMAL HIGH (ref <1.0)

## 2019-09-05 LAB — SEDIMENTATION RATE: Sed Rate: 62 mm/hr — ABNORMAL HIGH (ref 0–20)

## 2019-09-05 LAB — APTT: aPTT: 29 seconds (ref 24–36)

## 2019-09-05 LAB — SURGICAL PCR SCREEN
MRSA, PCR: POSITIVE — AB
Staphylococcus aureus: POSITIVE — AB

## 2019-09-05 LAB — PROTIME-INR
INR: 1 (ref 0.8–1.2)
Prothrombin Time: 13.1 seconds (ref 11.4–15.2)

## 2019-09-05 MED ORDER — ENSURE PRE-SURGERY PO LIQD
296.0000 mL | Freq: Once | ORAL | Status: DC
  Filled 2019-09-05: qty 296

## 2019-09-05 NOTE — Patient Instructions (Signed)
Your procedure is scheduled on: 09/12/2019 Wed Report to Same Day Surgery 2nd floor medical mall [facility] Entrance-take elevator on left to 2nd floor.  Check in with surgery information desk.) To find out your arrival time please call [phone] between 1PM - 3PM on 09/11/2019 Tues  Remember: Instructions that are not followed completely may result in serious medical risk, up to and including death, or upon the discretion of your surgeon and anesthesiologist your surgery may need to be rescheduled.    _x___ 1. Do not eat food after midnight the night before your procedure. You may drink clear liquids up to 2 hours before you are scheduled to arrive at the hospital for your procedure.  Do not drink clear liquids within 2 hours of your scheduled arrival to the hospital.  Clear liquids include  --Water or Apple juice without pulp  --Clear carbohydrate beverage such as ClearFast or Gatorade  --Black Coffee or Clear Tea (No milk, no creamers, do not add anything to                  the coffee or Tea Type 1 and type 2 diabetics should only drink water.   ____Ensure clear carbohydrate drink on the way to the hospital for bariatric patients  ____Ensure clear carbohydrate drink 3 hours before surgery.   No gum chewing or hard candies.     __x__ 2. No Alcohol for 24 hours before or after surgery.   __x__3. No Smoking or e-cigarettes for 24 prior to surgery.  Do not use any chewable tobacco products for at least 6 hour prior to surgery   ____  4. Bring all medications with you on the day of surgery if instructed.    __x__ 5. Notify your doctor if there is any change in your medical condition     (cold, fever, infections).    x___6. On the morning of surgery brush your teeth with toothpaste and water.  You may rinse your mouth with mouth wash if you wish.  Do not swallow any toothpaste or mouthwash.   Do not wear jewelry, make-up, hairpins, clips or nail polish.  Do not wear lotions,  powders, or perfumes. You may wear deodorant.  Do not shave 48 hours prior to surgery. Men may shave face and neck.  Do not bring valuables to the hospital.    [facility] is not responsible for any belongings or valuables.               Contacts, dentures or bridgework may not be worn into surgery.  Leave your suitcase in the car. After surgery it may be brought to your room.  For patients admitted to the hospital, discharge time is determined by your                       treatment team.  _  Patients discharged the day of surgery will not be allowed to drive home.  You will need someone to drive you home and stay with you the night of your procedure.    Please read over the following fact sheets that you were given:   [facility] Preparing for Surgery and or MRSA Information   _x___ Take anti-hypertensive listed below, cardiac, seizure, asthma,     anti-reflux and psychiatric medicines. These include:  1. amLODipine (NORVASC) 10 MG tablet  2.traMADol (ULTRAM) 50 MG tablet if needed  3.  4.  5.  6.  ____Fleets enema or Magnesium  Citrate as directed.   _x___ Use CHG Soap or sage wipes as directed on instruction sheet   ____ Use inhalers on the day of surgery and bring to hospital day of surgery  ____ Stop Metformin and Janumet 2 days prior to surgery.    ____ Take 1/2 of usual insulin dose the night before surgery and none on the morning     surgery.   _x___ Follow recommendations from Cardiologist, Pulmonologist or PCP regarding          stopping Aspirin, Coumadin, Plavix ,Eliquis, Effient, or Pradaxa, and Pletal.  X____Stop Anti-inflammatories such as Advil, Aleve, Ibuprofen, Motrin, Naproxen, Naprosyn, Goodies powders or aspirin products. OK to take Tylenol and                          Celebrex.   _x___ Stop supplements until after surgery.  But may continue Vitamin D, Vitamin B,       and multivitamin.   ____ Bring C-Pap to the hospital.

## 2019-09-05 NOTE — Pre-Procedure Instructions (Signed)
Incentive spirometry and carbohydrate drink given along with instructions. 

## 2019-09-07 ENCOUNTER — Other Ambulatory Visit: Payer: Self-pay

## 2019-09-07 ENCOUNTER — Other Ambulatory Visit
Admission: RE | Admit: 2019-09-07 | Discharge: 2019-09-07 | Disposition: A | Discharge status: Home / Self Care | Payer: Managed Care, Other (non HMO) | Source: Ambulatory Visit | Attending: Orthopedic Surgery | Admitting: Orthopedic Surgery

## 2019-09-07 DIAGNOSIS — Z20828 Contact with and (suspected) exposure to other viral communicable diseases: Secondary | ICD-10-CM | POA: Diagnosis not present

## 2019-09-07 DIAGNOSIS — Z01812 Encounter for preprocedural laboratory examination: Secondary | ICD-10-CM | POA: Diagnosis not present

## 2019-09-07 LAB — SARS CORONAVIRUS 2 (TAT 6-24 HRS): SARS Coronavirus 2: NEGATIVE

## 2019-09-07 LAB — URINE CULTURE
Culture: >=100000 — AB
Special Requests: NORMAL

## 2019-09-11 ENCOUNTER — Encounter: Payer: Self-pay | Admitting: Orthopedic Surgery

## 2019-09-11 NOTE — H&P (Signed)
ORTHOPAEDIC HISTORY & PHYSICAL  Progress Notes by Michelene GardenerSmith, Benjamin Bryan, PA at 09/04/2019 9:45 AM  [facility] Chief Complaint:       Chief Complaint  Patient presents with  . Knee Pain    H & P RIGHT KNEE    History of Present Illness:    [name] is a 48 y.o. female that presents to clinic today for her preoperative history and evaluation.  Patient presents unaccompanied. The patient is scheduled to undergo a right total knee arthroplasty on 09/12/19 by Dr. Ernest PineHooten. Her pain began several years ago.  The pain is located primarily along the medial  aspect of the knee.  She describes her pain as moderate to severe.  She reports associated tightness and weakness, but denies giving away, catching, clicking, or locking.  She denies associated numbness or tingling.  Patient also denies history of blood clots or significant cardiac history.  The patient's symptoms have progressed to the point that they decrease her quality of life. The patient has previously undergone conservative treatment including NSAIDS and injections to the knee without adequate control of her symptoms.  Of note, patient is previously status post left total knee arthroplasty on 12/06/2018 by Dr. Ernest PineHooten.  Patient states she is pleased with the left knee.  Patient states with her previous total knee replacement she start outpatient therapy immediately after surgery and states that she plans to do the same with this knee replacement.  Patient states that she already has an outpatient physical therapy appointment set up for the Monday following her surgery.  Past Medical, Surgical, Family, Social History, Allergies, Medications:   Past Medical History:      Past Medical History:  Diagnosis Date  . Chicken pox   . Hypertension   . Migraines     Past Surgical History:       Past Surgical History:  Procedure Laterality Date  . Left knee arthroscopy with  partial medial menisectomy, partial synovectomy  09/08/2017   Dr Rosita KeaMenz  . Left total knee arthroplasty using computer-assisted navigation  12/06/2018   Dr Ernest PineHooten    Current Medications:  Current Medications        Current Outpatient Medications  Medication Sig Dispense Refill  . amLODIPine (NORVASC) 10 MG tablet 10 mg once daily       . celecoxib (CELEBREX) 200 MG capsule Take 1 capsule (200 mg total) by mouth 2 (two) times daily 60 capsule 4  . cetirizine (ZYRTEC) 10 MG tablet Take 10 mg by mouth once daily.      . ciprofloxacin HCl (CIPRO) 250 MG tablet     . HEATHER 0.35 mg tablet     . L norgest/e.estradiol-e.estrad (CAMRESE) 0.15 mg-30 mcg (84)/10 mcg (7) TAKE 1 TABLET BY MOUTH ONCE A DAY    . nitrofurantoin, macrocrystal-monohydrate, (MACROBID) 100 MG capsule     . ondansetron (ZOFRAN-ODT) 4 MG disintegrating tablet     . rizatriptan (MAXALT) 10 MG tablet Take 10 mg by mouth once as needed.      . SUMAtriptan (IMITREX) 50 MG tablet Take 50 mg by mouth once as needed.      . traMADoL (ULTRAM) 50 mg tablet Take by mouth     No current facility-administered medications for this visit.       Allergies:       Allergies  Allergen Reactions  . Sulfa (Sulfonamide Antibiotics) Shortness Of Breath, Swelling and Other (See Comments)    Swelling of  throat Almost swelled throat shut Swelling of throat  . Penicillins Hives and Other (See Comments)    Did it involve swelling of the face/tongue/throat, SOB, or low BP? Unknown Did it involve sudden or severe rash/hives, skin peeling, or any reaction on the inside of your mouth or nose? Unknown Did you need to seek medical attention at a hospital or doctor's office? Yes When did it last happen? Childhood reaction  If all above answers are "NO", may proceed with cephalosporin use. Did it involve swelling of the face/tongue/throat, SOB, or low BP? Unknown Did it involve sudden or severe  rash/hives, skin peeling, or any reaction on the inside of your mouth or nose? Unknown Did you need to seek medical attention at a hospital or doctor's office? Yes When did it last happen? Childhood reaction  If all above answers are "NO", may proceed with cephalosporin use.  Did it involve swelling of the face/tongue/throat, SOB, or low BP? Unknown Did it involve sudden or severe rash/hives, skin peeling, or any reaction on the inside of your mouth or nose? Unknown Did you need to seek medical attention at a hospital or doctor's office? Yes When did it last happen? Childhood reaction  If all above answers are "NO", may proceed with cephalosporin use.  . Nitrofurantoin Macrocrystal Nausea And Vomiting  . Doxycycline Itching, Nausea and Vomiting  . Propoxyphene Vomiting and Nausea And Vomiting    Darvocet    Social History:  Social History  Social History        Socioeconomic History  . Marital status: Married    Spouse name: Joe  . Number of children: 1  . Years of education: 35  . Highest education level: High school graduate  Occupational History  . Occupation: Environmental education officer- Agricultural engineer  Social Needs  . Financial resource strain: Patient refused  . Food insecurity    Worry: Patient refused    Inability: Patient refused  . Transportation needs    Medical: Patient refused    Non-medical: Patient refused  Tobacco Use  . Smoking status: Never Smoker  . Smokeless tobacco: Never Used  Substance and Sexual Activity  . Alcohol use: No  . Drug use: No  . Sexual activity: Yes    Partners: Male  Lifestyle  . Physical activity    Days per week: Patient refused    Minutes per session: Patient refused  . Stress: Patient refused  Relationships  . Social Musician on phone: Patient refused    Gets together: Patient refused    Attends religious service: Patient refused    Active member of club or organization: Patient  refused    Attends meetings of clubs or organizations: Patient refused    Relationship status: Patient refused  Other Topics Concern  . Not on file  Social History Narrative  . Not on file      Family History:       Family History  Problem Relation Age of Onset  . High blood pressure (Hypertension) Mother   . Hyperlipidemia (Elevated cholesterol) Mother   . High blood pressure (Hypertension) Father   . Hyperlipidemia (Elevated cholesterol) Father     Review of Systems:   A 10+ ROS was performed, reviewed, and the pertinent orthopaedic findings are documented in the HPI.    Physical Examination:   BP 124/70   Ht 160 cm (5\' 3" )   Wt 85.5 kg (188 lb 6.4 oz)   BMI 33.37 kg/m   Patient is  a well-developed, well-nourished female in no acute distress. Patient has normal mood and affect. Patient is alert and oriented to person, place, and time.   HEENT: Atraumatic, normocephalic.  Pupils equal and reactive to light.  Extraocular motion intact.  Noninjected sclera.  Cardiovascular: Regular rate and rhythm, with no murmurs, rubs, or gallops.  Distal pulses palpable.  Respiratory: Lungs clear to auscultation bilaterally.   Right Knee: Soft tissue swelling:minimal Effusion:none Erythema:none Crepitance:minimal Tenderness:medial Alignment:relative varus Mediolateral laxity:medial pseudolaxity Posterior GUR:[id number] Patellar tracking:Good tracking without evidence of subluxation or tilt Atrophy:No significantatrophy.  Quadriceps tone was fair to good. Range of motion:0/0/130degrees  Patient able to plantarflex and dorsiflex the right ankle.  Patient able to flex and extend the hallux of the right foot.  Sensation intact over the saphenous, lateral sural  cutaneous, superficial fibular, and deep fibular nerve distributions.  Tests Performed/Reviewed:  X-rays  Anteroposterior, lateral, and sunrise views of the knee were ordered and obtained.  Images reveal complete loss of medial joint space with bone-on-bone contact and subchondral sclerosis of the bone.  Osteophyte formation noted.  Lateral compartment remains relatively well-preserved with osteophyte formation.  No fractures noted.   Impression:     ICD-10-CM  1. Primary osteoarthritis of right knee  M17.11  2. Presence of left artificial knee joint  [id number]      Plan:   The patient has end-stage degenerative changes of the right knee.  It was explained to the patient that the condition is progressive in nature.  Having failed conservative treatment, the patient has elected to proceed with a total joint arthroplasty.  The patient will undergo a total joint arthroplasty with Dr. Marry Guan.  The risks of surgery, including blood clot and infection, were discussed with the patient.  Measures to reduce these risks, including the use of anticoagulation, perioperative antibiotics, and early ambulation were discussed.  The importance of postoperative physical therapy was discussed with the patient. The patient elects to proceed with surgery. The patient is instructed to stop all blood thinners prior to surgery.  The patient is instructed to call the hospital the day before surgery to learn of the proper arrival time.    Contact our office with any questions or concerns.  Follow up as indicated, or sooner should any new problems arise, if conditions worsen, or if they are otherwise concerned.   Gwenlyn Fudge, PA [facility] [address] [employer] [place], [place] [address] Phone: [phone]  This note was generated in part with voice recognition software and I apologize for any typographical errors that were not detected and corrected.      Electronically signed by Gwenlyn Fudge, PA on 09/05/2019 3:18 PM

## 2019-09-12 ENCOUNTER — Other Ambulatory Visit: Payer: Self-pay

## 2019-09-12 ENCOUNTER — Encounter
Admission: RE | Disposition: A | Discharge status: Home / Self Care | Payer: Self-pay | Source: Home / Self Care | Attending: Orthopedic Surgery

## 2019-09-12 ENCOUNTER — Inpatient Hospital Stay: Payer: Managed Care, Other (non HMO)

## 2019-09-12 ENCOUNTER — Encounter: Payer: Self-pay | Admitting: *Deleted

## 2019-09-12 ENCOUNTER — Inpatient Hospital Stay: Payer: Managed Care, Other (non HMO) | Admitting: Registered Nurse

## 2019-09-12 ENCOUNTER — Inpatient Hospital Stay
Admission: RE | Admit: 2019-09-12 | Discharge: 2019-09-14 | DRG: 470 | Disposition: A | Discharge status: Home / Self Care | Payer: Managed Care, Other (non HMO) | Attending: Orthopedic Surgery | Admitting: Orthopedic Surgery

## 2019-09-12 DIAGNOSIS — R5382 Chronic fatigue, unspecified: Secondary | ICD-10-CM | POA: Diagnosis present

## 2019-09-12 DIAGNOSIS — Z885 Allergy status to narcotic agent status: Secondary | ICD-10-CM

## 2019-09-12 DIAGNOSIS — M25761 Osteophyte, right knee: Secondary | ICD-10-CM | POA: Diagnosis present

## 2019-09-12 DIAGNOSIS — Z79891 Long term (current) use of opiate analgesic: Secondary | ICD-10-CM | POA: Diagnosis not present

## 2019-09-12 DIAGNOSIS — Z880 Allergy status to penicillin: Secondary | ICD-10-CM

## 2019-09-12 DIAGNOSIS — Z79899 Other long term (current) drug therapy: Secondary | ICD-10-CM

## 2019-09-12 DIAGNOSIS — M1711 Unilateral primary osteoarthritis, right knee: Secondary | ICD-10-CM | POA: Diagnosis present

## 2019-09-12 DIAGNOSIS — J309 Allergic rhinitis, unspecified: Secondary | ICD-10-CM | POA: Diagnosis present

## 2019-09-12 DIAGNOSIS — Z96652 Presence of left artificial knee joint: Secondary | ICD-10-CM | POA: Diagnosis present

## 2019-09-12 DIAGNOSIS — Z8249 Family history of ischemic heart disease and other diseases of the circulatory system: Secondary | ICD-10-CM | POA: Diagnosis not present

## 2019-09-12 DIAGNOSIS — Z881 Allergy status to other antibiotic agents status: Secondary | ICD-10-CM

## 2019-09-12 DIAGNOSIS — Z20828 Contact with and (suspected) exposure to other viral communicable diseases: Secondary | ICD-10-CM | POA: Diagnosis present

## 2019-09-12 DIAGNOSIS — I10 Essential (primary) hypertension: Secondary | ICD-10-CM | POA: Diagnosis present

## 2019-09-12 DIAGNOSIS — Z888 Allergy status to other drugs, medicaments and biological substances status: Secondary | ICD-10-CM

## 2019-09-12 DIAGNOSIS — Z882 Allergy status to sulfonamides status: Secondary | ICD-10-CM | POA: Diagnosis not present

## 2019-09-12 DIAGNOSIS — Z96659 Presence of unspecified artificial knee joint: Secondary | ICD-10-CM

## 2019-09-12 HISTORY — PX: KNEE ARTHROPLASTY: SHX992

## 2019-09-12 LAB — POCT PREGNANCY, URINE: Preg Test, Ur: NEGATIVE

## 2019-09-12 SURGERY — ARTHROPLASTY, KNEE, TOTAL, USING IMAGELESS COMPUTER-ASSISTED NAVIGATION
Anesthesia: General | Site: Knee | Laterality: Right

## 2019-09-12 MED ORDER — FLUOCINOLONE ACETONIDE 0.01 % OT OIL
2.0000 [drp] | TOPICAL_OIL | Freq: Two times a day (BID) | OTIC | Status: DC | PRN
  Filled 2019-09-12: qty 20

## 2019-09-12 MED ORDER — CEFAZOLIN SODIUM-DEXTROSE 2-4 GM/100ML-% IV SOLN
2.0000 g | INTRAVENOUS | Status: AC
  Administered 2019-09-12: 2 g via INTRAVENOUS

## 2019-09-12 MED ORDER — BISACODYL 10 MG RE SUPP: 10.0000 mg | Freq: Every day | RECTAL | Status: DC | PRN

## 2019-09-12 MED ORDER — SUMATRIPTAN SUCCINATE 50 MG PO TABS
50.0000 mg | ORAL_TABLET | ORAL | Status: DC | PRN
  Filled 2019-09-12: qty 1

## 2019-09-12 MED ORDER — PHENOL 1.4 % MT LIQD
1.0000 | OROMUCOSAL | Status: DC | PRN
  Filled 2019-09-12: qty 177

## 2019-09-12 MED ORDER — ROCURONIUM BROMIDE 100 MG/10ML IV SOLN
INTRAVENOUS | Status: DC | PRN
  Administered 2019-09-12: 30 mg via INTRAVENOUS
  Administered 2019-09-12: 50 mg via INTRAVENOUS
  Administered 2019-09-12: 20 mg via INTRAVENOUS

## 2019-09-12 MED ORDER — ENOXAPARIN SODIUM 30 MG/0.3ML ~~LOC~~ SOLN
30.0000 mg | Freq: Two times a day (BID) | SUBCUTANEOUS | Status: DC
  Administered 2019-09-13 – 2019-09-14 (×3): 30 mg via SUBCUTANEOUS
  Filled 2019-09-12 (×3): qty 0.3

## 2019-09-12 MED ORDER — SODIUM CHLORIDE 0.9 % IV SOLN
INTRAVENOUS | Status: DC | PRN
  Administered 2019-09-12: 60 mL

## 2019-09-12 MED ORDER — ROCURONIUM BROMIDE 50 MG/5ML IV SOLN
INTRAVENOUS | Status: AC
  Filled 2019-09-12: qty 1

## 2019-09-12 MED ORDER — TRANEXAMIC ACID-NACL 1000-0.7 MG/100ML-% IV SOLN
INTRAVENOUS | Status: AC
  Filled 2019-09-12: qty 100

## 2019-09-12 MED ORDER — ONDANSETRON HCL 4 MG PO TABS: 4.0000 mg | ORAL_TABLET | Freq: Four times a day (QID) | ORAL | Status: DC | PRN

## 2019-09-12 MED ORDER — VANCOMYCIN HCL IN DEXTROSE 1-5 GM/200ML-% IV SOLN: 1000.0000 mg | Freq: Once | INTRAVENOUS | Status: DC

## 2019-09-12 MED ORDER — VANCOMYCIN HCL IN DEXTROSE 1-5 GM/200ML-% IV SOLN
INTRAVENOUS | Status: AC
  Administered 2019-09-12: 1000 mg via INTRAVENOUS
  Filled 2019-09-12: qty 200

## 2019-09-12 MED ORDER — CLINDAMYCIN PHOSPHATE 600 MG/50ML IV SOLN
600.0000 mg | Freq: Four times a day (QID) | INTRAVENOUS | Status: AC
  Administered 2019-09-13 (×2): 600 mg via INTRAVENOUS
  Filled 2019-09-12 (×4): qty 50

## 2019-09-12 MED ORDER — SODIUM CHLORIDE 0.9 % IV SOLN
INTRAVENOUS | Status: DC
  Administered 2019-09-13: via INTRAVENOUS

## 2019-09-12 MED ORDER — OXYCODONE HCL 5 MG PO TABS
5.0000 mg | ORAL_TABLET | ORAL | Status: DC | PRN
  Administered 2019-09-13 (×2): 5 mg via ORAL
  Filled 2019-09-12 (×4): qty 1

## 2019-09-12 MED ORDER — MIDAZOLAM HCL 2 MG/2ML IJ SOLN
INTRAMUSCULAR | Status: DC | PRN
  Administered 2019-09-12: 2 mg via INTRAVENOUS

## 2019-09-12 MED ORDER — VANCOMYCIN HCL IN DEXTROSE 1-5 GM/200ML-% IV SOLN
1000.0000 mg | Freq: Once | INTRAVENOUS | Status: AC
  Administered 2019-09-12: 13:00:00 1000 mg via INTRAVENOUS

## 2019-09-12 MED ORDER — AMLODIPINE BESYLATE 10 MG PO TABS
10.0000 mg | ORAL_TABLET | Freq: Every day | ORAL | Status: DC
  Administered 2019-09-14: 10 mg via ORAL
  Filled 2019-09-12: qty 1

## 2019-09-12 MED ORDER — TRAMADOL HCL 50 MG PO TABS
50.0000 mg | ORAL_TABLET | ORAL | Status: DC | PRN
  Administered 2019-09-12 – 2019-09-13 (×2): 100 mg via ORAL
  Filled 2019-09-12 (×2): qty 2

## 2019-09-12 MED ORDER — CEFAZOLIN SODIUM-DEXTROSE 2-4 GM/100ML-% IV SOLN
INTRAVENOUS | Status: AC
  Filled 2019-09-12: qty 100

## 2019-09-12 MED ORDER — FENTANYL CITRATE (PF) 100 MCG/2ML IJ SOLN
25.0000 ug | INTRAMUSCULAR | Status: AC | PRN
  Administered 2019-09-12 (×6): 25 ug via INTRAVENOUS

## 2019-09-12 MED ORDER — LIDOCAINE HCL (PF) 2 % IJ SOLN
INTRAMUSCULAR | Status: AC
  Filled 2019-09-12: qty 10

## 2019-09-12 MED ORDER — MAGNESIUM HYDROXIDE 400 MG/5ML PO SUSP
30.0000 mL | Freq: Every day | ORAL | Status: DC
  Administered 2019-09-13: 30 mL via ORAL
  Filled 2019-09-12: qty 30

## 2019-09-12 MED ORDER — ACETAMINOPHEN 10 MG/ML IV SOLN
1000.0000 mg | Freq: Four times a day (QID) | INTRAVENOUS | Status: AC
  Administered 2019-09-12 – 2019-09-13 (×2): 1000 mg via INTRAVENOUS
  Filled 2019-09-12 (×3): qty 100

## 2019-09-12 MED ORDER — FERROUS SULFATE 325 (65 FE) MG PO TABS
325.0000 mg | ORAL_TABLET | Freq: Two times a day (BID) | ORAL | Status: DC
  Administered 2019-09-13 – 2019-09-14 (×2): 325 mg via ORAL
  Filled 2019-09-12 (×3): qty 1

## 2019-09-12 MED ORDER — LACTATED RINGERS IV SOLN
INTRAVENOUS | Status: DC
  Administered 2019-09-12 (×2): via INTRAVENOUS

## 2019-09-12 MED ORDER — SENNOSIDES-DOCUSATE SODIUM 8.6-50 MG PO TABS
1.0000 | ORAL_TABLET | Freq: Two times a day (BID) | ORAL | Status: DC
  Administered 2019-09-12 – 2019-09-14 (×4): 1 via ORAL
  Filled 2019-09-12 (×4): qty 1

## 2019-09-12 MED ORDER — MENTHOL 3 MG MT LOZG
1.0000 | LOZENGE | OROMUCOSAL | Status: DC | PRN
  Filled 2019-09-12: qty 9

## 2019-09-12 MED ORDER — ONDANSETRON HCL 4 MG/2ML IJ SOLN
INTRAMUSCULAR | Status: DC | PRN
  Administered 2019-09-12: 4 mg via INTRAVENOUS

## 2019-09-12 MED ORDER — BUPIVACAINE HCL (PF) 0.25 % IJ SOLN
INTRAMUSCULAR | Status: DC | PRN
  Administered 2019-09-12: 60 mL

## 2019-09-12 MED ORDER — OXYCODONE HCL 5 MG/5ML PO SOLN: 5.0000 mg | Freq: Once | ORAL | Status: DC | PRN

## 2019-09-12 MED ORDER — METOCLOPRAMIDE HCL 10 MG PO TABS
10.0000 mg | ORAL_TABLET | Freq: Three times a day (TID) | ORAL | Status: AC
  Administered 2019-09-12 – 2019-09-14 (×5): 10 mg via ORAL
  Filled 2019-09-12 (×6): qty 1

## 2019-09-12 MED ORDER — FENTANYL CITRATE (PF) 100 MCG/2ML IJ SOLN
INTRAMUSCULAR | Status: DC | PRN
  Administered 2019-09-12 (×5): 50 ug via INTRAVENOUS

## 2019-09-12 MED ORDER — FENTANYL CITRATE (PF) 250 MCG/5ML IJ SOLN
INTRAMUSCULAR | Status: AC
  Filled 2019-09-12: qty 5

## 2019-09-12 MED ORDER — DEXAMETHASONE SODIUM PHOSPHATE 10 MG/ML IJ SOLN
INTRAMUSCULAR | Status: AC
  Administered 2019-09-12: 8 mg via INTRAVENOUS
  Filled 2019-09-12: qty 1

## 2019-09-12 MED ORDER — PROPOFOL 10 MG/ML IV BOLUS
INTRAVENOUS | Status: DC | PRN
  Administered 2019-09-12: 150 mg via INTRAVENOUS

## 2019-09-12 MED ORDER — ONDANSETRON HCL 4 MG/2ML IJ SOLN
INTRAMUSCULAR | Status: AC
  Filled 2019-09-12: qty 2

## 2019-09-12 MED ORDER — GABAPENTIN 300 MG PO CAPS
300.0000 mg | ORAL_CAPSULE | Freq: Every day | ORAL | Status: DC
  Administered 2019-09-13 (×2): 300 mg via ORAL
  Filled 2019-09-12 (×2): qty 1

## 2019-09-12 MED ORDER — LORATADINE 10 MG PO TABS
10.0000 mg | ORAL_TABLET | Freq: Every day | ORAL | Status: DC
  Administered 2019-09-13 – 2019-09-14 (×2): 10 mg via ORAL
  Filled 2019-09-12 (×2): qty 1

## 2019-09-12 MED ORDER — HYDROMORPHONE HCL 1 MG/ML IJ SOLN
0.2500 mg | INTRAMUSCULAR | Status: DC | PRN
  Administered 2019-09-12 (×6): 0.25 mg via INTRAVENOUS

## 2019-09-12 MED ORDER — METOCLOPRAMIDE HCL 10 MG PO TABS: 5.0000 mg | ORAL_TABLET | Freq: Three times a day (TID) | ORAL | Status: DC | PRN

## 2019-09-12 MED ORDER — CELECOXIB 200 MG PO CAPS
200.0000 mg | ORAL_CAPSULE | Freq: Two times a day (BID) | ORAL | Status: DC
  Administered 2019-09-13 – 2019-09-14 (×4): 200 mg via ORAL
  Filled 2019-09-12 (×4): qty 1

## 2019-09-12 MED ORDER — TRANEXAMIC ACID-NACL 1000-0.7 MG/100ML-% IV SOLN
1000.0000 mg | Freq: Once | INTRAVENOUS | Status: AC
  Administered 2019-09-12: 18:00:00 1000 mg via INTRAVENOUS

## 2019-09-12 MED ORDER — NEOMYCIN-POLYMYXIN B GU 40-200000 IR SOLN
Status: DC | PRN
  Administered 2019-09-12: 16 mL

## 2019-09-12 MED ORDER — GABAPENTIN 300 MG PO CAPS
300.0000 mg | ORAL_CAPSULE | Freq: Once | ORAL | Status: AC
  Administered 2019-09-12: 11:00:00 300 mg via ORAL

## 2019-09-12 MED ORDER — OXYCODONE HCL 5 MG PO TABS
10.0000 mg | ORAL_TABLET | ORAL | Status: DC | PRN
  Administered 2019-09-13 – 2019-09-14 (×3): 10 mg via ORAL
  Filled 2019-09-12 (×2): qty 2

## 2019-09-12 MED ORDER — HYDROMORPHONE HCL 1 MG/ML IJ SOLN: 0.5000 mg | INTRAMUSCULAR | Status: DC | PRN

## 2019-09-12 MED ORDER — ACETAMINOPHEN 325 MG PO TABS: 325.0000 mg | ORAL_TABLET | Freq: Four times a day (QID) | ORAL | Status: DC | PRN

## 2019-09-12 MED ORDER — GABAPENTIN 300 MG PO CAPS
ORAL_CAPSULE | ORAL | Status: AC
  Administered 2019-09-12: 300 mg via ORAL
  Filled 2019-09-12: qty 1

## 2019-09-12 MED ORDER — CELECOXIB 200 MG PO CAPS
400.0000 mg | ORAL_CAPSULE | Freq: Once | ORAL | Status: AC
  Administered 2019-09-12: 400 mg via ORAL

## 2019-09-12 MED ORDER — HYDROMORPHONE HCL 1 MG/ML IJ SOLN
INTRAMUSCULAR | Status: AC
  Administered 2019-09-12: 0.25 mg via INTRAVENOUS
  Filled 2019-09-12: qty 1

## 2019-09-12 MED ORDER — PROPOFOL 500 MG/50ML IV EMUL
INTRAVENOUS | Status: AC
  Filled 2019-09-12: qty 50

## 2019-09-12 MED ORDER — FENTANYL CITRATE (PF) 100 MCG/2ML IJ SOLN
INTRAMUSCULAR | Status: AC
  Administered 2019-09-12: 25 ug via INTRAVENOUS
  Filled 2019-09-12: qty 2

## 2019-09-12 MED ORDER — DIPHENHYDRAMINE HCL 12.5 MG/5ML PO ELIX: 12.5000 mg | ORAL_SOLUTION | ORAL | Status: DC | PRN

## 2019-09-12 MED ORDER — TRANEXAMIC ACID-NACL 1000-0.7 MG/100ML-% IV SOLN
1000.0000 mg | INTRAVENOUS | Status: AC
  Administered 2019-09-12: 1000 mg via INTRAVENOUS

## 2019-09-12 MED ORDER — DEXMEDETOMIDINE HCL 200 MCG/2ML IV SOLN
INTRAVENOUS | Status: DC | PRN
  Administered 2019-09-12 (×2): 4 ug via INTRAVENOUS
  Administered 2019-09-12: 12 ug via INTRAVENOUS

## 2019-09-12 MED ORDER — ACETAMINOPHEN 10 MG/ML IV SOLN
INTRAVENOUS | Status: DC | PRN
  Administered 2019-09-12: 1000 mg via INTRAVENOUS

## 2019-09-12 MED ORDER — CELECOXIB 200 MG PO CAPS
ORAL_CAPSULE | ORAL | Status: AC
  Filled 2019-09-12: qty 2

## 2019-09-12 MED ORDER — SUGAMMADEX SODIUM 200 MG/2ML IV SOLN
INTRAVENOUS | Status: DC | PRN
  Administered 2019-09-12: 200 mg via INTRAVENOUS

## 2019-09-12 MED ORDER — METOCLOPRAMIDE HCL 5 MG/ML IJ SOLN: 5.0000 mg | Freq: Three times a day (TID) | INTRAMUSCULAR | Status: DC | PRN

## 2019-09-12 MED ORDER — MIDAZOLAM HCL 2 MG/2ML IJ SOLN
INTRAMUSCULAR | Status: AC
  Filled 2019-09-12: qty 2

## 2019-09-12 MED ORDER — DEXAMETHASONE SODIUM PHOSPHATE 10 MG/ML IJ SOLN
8.0000 mg | Freq: Once | INTRAMUSCULAR | Status: AC
  Administered 2019-09-12: 12:00:00 8 mg via INTRAVENOUS

## 2019-09-12 MED ORDER — LIDOCAINE HCL (CARDIAC) PF 100 MG/5ML IV SOSY
PREFILLED_SYRINGE | INTRAVENOUS | Status: DC | PRN
  Administered 2019-09-12: 80 mg via INTRAVENOUS

## 2019-09-12 MED ORDER — CHLORHEXIDINE GLUCONATE 4 % EX LIQD
60.0000 mL | Freq: Once | CUTANEOUS | Status: AC
  Administered 2019-09-12: 4 via TOPICAL

## 2019-09-12 MED ORDER — KETAMINE HCL 50 MG/ML IJ SOLN
INTRAMUSCULAR | Status: AC
  Filled 2019-09-12: qty 10

## 2019-09-12 MED ORDER — SODIUM CHLORIDE (PF) 0.9 % IJ SOLN
INTRAMUSCULAR | Status: AC
  Filled 2019-09-12: qty 10

## 2019-09-12 MED ORDER — ONDANSETRON HCL 4 MG/2ML IJ SOLN: 4.0000 mg | Freq: Four times a day (QID) | INTRAMUSCULAR | Status: DC | PRN

## 2019-09-12 MED ORDER — OXYCODONE HCL 5 MG PO TABS: 5.0000 mg | ORAL_TABLET | Freq: Once | ORAL | Status: DC | PRN

## 2019-09-12 MED ORDER — FAMOTIDINE 20 MG PO TABS
20.0000 mg | ORAL_TABLET | Freq: Once | ORAL | Status: AC
  Administered 2019-09-12: 11:00:00 20 mg via ORAL

## 2019-09-12 MED ORDER — LEVONORGEST-ETH ESTRAD 91-DAY 0.15-0.03 &0.01 MG PO TABS: 1.0000 | ORAL_TABLET | Freq: Every day | ORAL | Status: DC

## 2019-09-12 MED ORDER — ALUM & MAG HYDROXIDE-SIMETH 200-200-20 MG/5ML PO SUSP: 30.0000 mL | ORAL | Status: DC | PRN

## 2019-09-12 MED ORDER — SUGAMMADEX SODIUM 200 MG/2ML IV SOLN
INTRAVENOUS | Status: AC
  Filled 2019-09-12: qty 2

## 2019-09-12 MED ORDER — DEXMEDETOMIDINE HCL IN NACL 80 MCG/20ML IV SOLN
INTRAVENOUS | Status: AC
  Filled 2019-09-12: qty 20

## 2019-09-12 MED ORDER — ACETAMINOPHEN 10 MG/ML IV SOLN
INTRAVENOUS | Status: AC
  Filled 2019-09-12: qty 100

## 2019-09-12 MED ORDER — KETAMINE HCL 10 MG/ML IJ SOLN
INTRAMUSCULAR | Status: DC | PRN
  Administered 2019-09-12: 10 mg via INTRAVENOUS
  Administered 2019-09-12: 40 mg via INTRAVENOUS

## 2019-09-12 MED ORDER — FAMOTIDINE 20 MG PO TABS
ORAL_TABLET | ORAL | Status: AC
  Administered 2019-09-12: 20 mg via ORAL
  Filled 2019-09-12: qty 1

## 2019-09-12 MED ORDER — FLEET ENEMA 7-19 GM/118ML RE ENEM: 1.0000 | ENEMA | Freq: Once | RECTAL | Status: DC | PRN

## 2019-09-12 MED ORDER — TRANEXAMIC ACID-NACL 1000-0.7 MG/100ML-% IV SOLN
INTRAVENOUS | Status: AC
  Administered 2019-09-12: 1000 mg via INTRAVENOUS
  Filled 2019-09-12: qty 100

## 2019-09-12 MED ORDER — PANTOPRAZOLE SODIUM 40 MG PO TBEC
40.0000 mg | DELAYED_RELEASE_TABLET | Freq: Two times a day (BID) | ORAL | Status: DC
  Administered 2019-09-13 – 2019-09-14 (×4): 40 mg via ORAL
  Filled 2019-09-12 (×4): qty 1

## 2019-09-12 SURGICAL SUPPLY — 78 items
ATTUNE MED DOME PAT 32 KNEE (Knees) ×1 IMPLANT
ATTUNE PS FEM RT SZ 3 CEM KNEE (Femur) ×1 IMPLANT
ATTUNE PSRP INSR SZ3 5 KNEE (Insert) ×1 IMPLANT
BASE TIBIAL ROT PLAT SZ 3 KNEE (Knees) IMPLANT
BATTERY INSTRU NAVIGATION (MISCELLANEOUS) ×8 IMPLANT
BLADE SAW 70X12.5 (BLADE) ×2 IMPLANT
BLADE SAW 90X13X1.19 OSCILLAT (BLADE) ×2 IMPLANT
BLADE SAW 90X25X1.19 OSCILLAT (BLADE) ×2 IMPLANT
BONE CEMENT GENTAMICIN (Cement) ×4 IMPLANT
CANISTER SUCT 3000ML PPV (MISCELLANEOUS) ×2 IMPLANT
CEMENT BONE GENTAMICIN 40 (Cement) IMPLANT
COOLER ICEMAN CLASSIC (MISCELLANEOUS) ×2 IMPLANT
COVER WAND RF STERILE (DRAPES) ×2 IMPLANT
CUFF TOURN SGL QUICK 24 (TOURNIQUET CUFF) ×1
CUFF TRNQT CYL 24X4X16.5-23 (TOURNIQUET CUFF) IMPLANT
DRAPE 3/4 80X56 (DRAPES) ×2 IMPLANT
DRSG DERMACEA 8X12 NADH (GAUZE/BANDAGES/DRESSINGS) ×2 IMPLANT
DRSG OPSITE POSTOP 4X12 (GAUZE/BANDAGES/DRESSINGS) ×1 IMPLANT
DRSG OPSITE POSTOP 4X14 (GAUZE/BANDAGES/DRESSINGS) ×2 IMPLANT
DRSG TEGADERM 4X4.75 (GAUZE/BANDAGES/DRESSINGS) ×2 IMPLANT
DURAPREP 26ML APPLICATOR (WOUND CARE) ×4 IMPLANT
ELECT REM PT RETURN 9FT ADLT (ELECTROSURGICAL) ×2
ELECTRODE REM PT RTRN 9FT ADLT (ELECTROSURGICAL) ×1 IMPLANT
EX-PIN ORTHOLOCK NAV 4X150 (PIN) ×4 IMPLANT
GLOVE BIO SURGEON STRL SZ7.5 (GLOVE) ×4 IMPLANT
GLOVE BIOGEL M STRL SZ7.5 (GLOVE) ×4 IMPLANT
GLOVE BIOGEL PI IND STRL 7.5 (GLOVE) ×1 IMPLANT
GLOVE BIOGEL PI INDICATOR 7.5 (GLOVE) ×1
GLOVE INDICATOR 8.0 STRL GRN (GLOVE) ×2 IMPLANT
GOWN STRL REUS W/ TWL LRG LVL3 (GOWN DISPOSABLE) ×2 IMPLANT
GOWN STRL REUS W/ TWL XL LVL3 (GOWN DISPOSABLE) ×1 IMPLANT
GOWN STRL REUS W/TWL LRG LVL3 (GOWN DISPOSABLE) ×2
GOWN STRL REUS W/TWL XL LVL3 (GOWN DISPOSABLE) ×1
HEMOVAC 400CC 10FR (MISCELLANEOUS) ×2 IMPLANT
HOLDER FOLEY CATH W/STRAP (MISCELLANEOUS) ×2 IMPLANT
HOOD PEEL AWAY FLYTE STAYCOOL (MISCELLANEOUS) ×4 IMPLANT
KIT TURNOVER KIT A (KITS) ×2 IMPLANT
KNIFE SCULPS 14X20 (INSTRUMENTS) ×2 IMPLANT
LABEL OR SOLS (LABEL) ×2 IMPLANT
MANIFOLD NEPTUNE II (INSTRUMENTS) ×2 IMPLANT
NDL SAFETY ECLIPSE 18X1.5 (NEEDLE) ×1 IMPLANT
NDL SPNL 20GX3.5 QUINCKE YW (NEEDLE) ×2 IMPLANT
NEEDLE HYPO 18GX1.5 SHARP (NEEDLE) ×1
NEEDLE SPNL 20GX3.5 QUINCKE YW (NEEDLE) ×4 IMPLANT
NS IRRIG 500ML POUR BTL (IV SOLUTION) ×2 IMPLANT
PACK TOTAL KNEE (MISCELLANEOUS) ×2 IMPLANT
PAD ABD DERMACEA PRESS 5X9 (GAUZE/BANDAGES/DRESSINGS) ×1 IMPLANT
PAD CAST CTTN 4X4 STRL (SOFTGOODS) IMPLANT
PAD COLD SHLDR UNI WRAP-ON (PAD) ×2
PAD COLD UNI WRAP-ON (PAD) IMPLANT
PAD WRAPON POLAR KNEE (MISCELLANEOUS) ×1 IMPLANT
PADDING CAST COTTON 4X4 STRL (SOFTGOODS) ×1
PENCIL SMOKE ULTRAEVAC 22 CON (MISCELLANEOUS) ×2 IMPLANT
PIN DRILL QUICK PACK ×2 IMPLANT
PIN FIXATION 1/8DIA X 3INL (PIN) ×6 IMPLANT
PULSAVAC PLUS IRRIG FAN TIP (DISPOSABLE) ×2
SOL .9 NS 3000ML IRR  AL (IV SOLUTION) ×1
SOL .9 NS 3000ML IRR UROMATIC (IV SOLUTION) ×1 IMPLANT
SOL PREP PVP 2OZ (MISCELLANEOUS) ×2
SOLUTION PREP PVP 2OZ (MISCELLANEOUS) ×1 IMPLANT
SPONGE DRAIN TRACH 4X4 STRL 2S (GAUZE/BANDAGES/DRESSINGS) ×2 IMPLANT
STAPLER SKIN PROX 35W (STAPLE) ×2 IMPLANT
STOCKINETTE BIAS CUT 6 980064 (GAUZE/BANDAGES/DRESSINGS) ×1 IMPLANT
STOCKINETTE IMPERV 14X48 (MISCELLANEOUS) IMPLANT
STRAP TIBIA SHORT (MISCELLANEOUS) ×2 IMPLANT
SUCTION FRAZIER HANDLE 10FR (MISCELLANEOUS) ×1
SUCTION TUBE FRAZIER 10FR DISP (MISCELLANEOUS) ×1 IMPLANT
SUT VIC AB 0 CT1 36 (SUTURE) ×3 IMPLANT
SUT VIC AB 1 CT1 36 (SUTURE) ×4 IMPLANT
SUT VIC AB 2-0 CT2 27 (SUTURE) ×2 IMPLANT
SYR 20ML LL LF (SYRINGE) ×2 IMPLANT
SYR 30ML LL (SYRINGE) ×4 IMPLANT
TIBIAL BASE ROT PLAT SZ 3 KNEE (Knees) ×2 IMPLANT
TIP FAN IRRIG PULSAVAC PLUS (DISPOSABLE) ×1 IMPLANT
TOWEL OR 17X26 4PK STRL BLUE (TOWEL DISPOSABLE) ×2 IMPLANT
TOWER CARTRIDGE SMART MIX (DISPOSABLE) ×2 IMPLANT
TRAY FOLEY MTR SLVR 16FR STAT (SET/KITS/TRAYS/PACK) ×2 IMPLANT
WRAPON POLAR PAD KNEE (MISCELLANEOUS)

## 2019-09-12 NOTE — Transfer of Care (Signed)
Immediate Anesthesia Transfer of Care Note  Patient: Alexandria [name]  Procedure(s) Performed: COMPUTER ASSISTED TOTAL KNEE ARTHROPLASTY (Right Knee)  Patient Location: PACU  Anesthesia Type:General  Level of Consciousness: sedated  Airway & Oxygen Therapy: Patient Spontanous Breathing and Patient connected to face mask oxygen  Post-op Assessment: Report given to RN and Post -op Vital signs reviewed and stable  Post vital signs: Reviewed and stable  Last Vitals:  Vitals Value Taken Time  BP 127/68 09/12/19 1746  Temp    Pulse 94 09/12/19 1748  Resp 20 09/12/19 1748  SpO2 92 % 09/12/19 1748  Vitals shown include unvalidated device data.  Last Pain:  Vitals:   09/12/19 1746  TempSrc:   PainSc: (P) Asleep      Patients Stated Pain Goal: 0 (95/63/87 [id number])  Complications: No apparent anesthesia complications

## 2019-09-12 NOTE — Anesthesia Procedure Notes (Signed)
Procedure Name: Intubation Date/Time: 09/12/2019 2:09 PM Performed by: Leeroy Cha, CRNA Pre-anesthesia Checklist: Patient identified, Emergency Drugs available, Suction available and Patient being monitored Patient Re-evaluated:Patient Re-evaluated prior to induction Oxygen Delivery Method: Circle system utilized Preoxygenation: Pre-oxygenation with 100% oxygen Induction Type: IV induction Ventilation: Mask ventilation without difficulty Laryngoscope Size: McGraph and 3 Grade View: Grade I Tube type: Oral Number of attempts: 1 Airway Equipment and Method: Stylet,  Oral airway and Video-laryngoscopy Placement Confirmation: ETT inserted through vocal cords under direct vision,  positive ETCO2 and breath sounds checked- equal and bilateral Secured at: 20 cm Tube secured with: Tape Dental Injury: Teeth and Oropharynx as per pre-operative assessment  Difficulty Due To: Difficult Airway- due to limited oral opening and Difficulty was anticipated

## 2019-09-12 NOTE — Anesthesia Post-op Follow-up Note (Signed)
Anesthesia QCDR form completed.        

## 2019-09-12 NOTE — Op Note (Signed)
OPERATIVE NOTE  DATE OF SURGERY:  09/12/2019  PATIENT NAME:  Alexandria [name]   DOB: 1970-12-04  MRN: [id number]  PRE-OPERATIVE DIAGNOSIS: Degenerative arthrosis of the right knee, primary  POST-OPERATIVE DIAGNOSIS:  Same  PROCEDURE:  Right total knee arthroplasty using computer-assisted navigation  SURGEON:  Jena Gauss. M.D.  ASSISTANT: Baldwin Jamaica, PA-C (present and scrubbed throughout the case, critical for assistance with exposure, retraction, instrumentation, and closure)  ANESTHESIA: general  ESTIMATED BLOOD LOSS: 50 mL  FLUIDS REPLACED: 1300 mL of crystalloid  TOURNIQUET TIME: 98 minutes  DRAINS: 2 medium Hemovac drains  SOFT TISSUE RELEASES: Anterior cruciate ligament, posterior cruciate ligament, deep and superficial medial collateral ligament, patellofemoral ligament  IMPLANTS UTILIZED: DePuy Attune size 3 posterior stabilized femoral component (cemented), size 3 rotating platform tibial component (cemented), 32 mm medialized dome patella (cemented), and a 5 mm stabilized rotating platform polyethylene insert.  INDICATIONS FOR SURGERY: Alexandria [name] is a 48 y.o. year old female with a long history of progressive knee pain. X-rays demonstrated severe degenerative changes in tricompartmental fashion. The patient had not seen any significant improvement despite conservative nonsurgical intervention. After discussion of the risks and benefits of surgical intervention, the patient expressed understanding of the risks benefits and agree with plans for total knee arthroplasty.   The risks, benefits, and alternatives were discussed at length including but not limited to the risks of infection, bleeding, nerve injury, stiffness, blood clots, the need for revision surgery, cardiopulmonary complications, among others, and they were willing to proceed.  PROCEDURE IN DETAIL: The patient was brought into the operating room and, after adequate general anesthesia was achieved, a  tourniquet was placed on the patient's upper thigh. The patient's knee and leg were cleaned and prepped with alcohol and DuraPrep and draped in the usual sterile fashion. A "timeout" was performed as per usual protocol. The lower extremity was exsanguinated using an Esmarch, and the tourniquet was inflated to 300 mmHg. An anterior longitudinal incision was made followed by a standard mid vastus approach. The deep fibers of the medial collateral ligament were elevated in a subperiosteal fashion off of the medial flare of the tibia so as to maintain a continuous soft tissue sleeve. The patella was subluxed laterally and the patellofemoral ligament was incised. Inspection of the knee demonstrated severe degenerative changes with full-thickness loss of articular cartilage. Osteophytes were debrided using a rongeur. Anterior and posterior cruciate ligaments were excised. Two 4.0 mm Schanz pins were inserted in the femur and into the tibia for attachment of the array of trackers used for computer-assisted navigation. Hip center was identified using a circumduction technique. Distal landmarks were mapped using the computer. The distal femur and proximal tibia were mapped using the computer. The distal femoral cutting guide was positioned using computer-assisted navigation so as to achieve a 5 distal valgus cut. The femur was sized and it was felt that a size 3 femoral component was appropriate. A size 3 femoral cutting guide was positioned and the anterior cut was performed and verified using the computer. This was followed by completion of the posterior and chamfer cuts. Femoral cutting guide for the central box was then positioned in the center box cut was performed.  Attention was then directed to the proximal tibia. Medial and lateral menisci were excised. The extramedullary tibial cutting guide was positioned using computer-assisted navigation so as to achieve a 0 varus-valgus alignment and 3 posterior slope. The  cut was performed and verified using the computer. The proximal  tibia was sized and it was felt that a size 3 tibial tray was appropriate. Tibial and femoral trials were inserted followed by insertion of a 5 mm polyethylene insert. The knee was felt to be tight medially. A Cobb elevator was used to elevate the superficial fibers of the medial collateral ligament.  This allowed for excellent mediolateral soft tissue balancing both in flexion and in full extension. Finally, the patella was cut and prepared so as to accommodate a 32 mm medialized dome patella. A patella trial was placed and the knee was placed through a range of motion with excellent patellar tracking appreciated. The femoral trial was removed after debridement of posterior osteophytes. The central post-hole for the tibial component was reamed followed by insertion of a keel punch. Tibial trials were then removed. Cut surfaces of bone were irrigated with copious amounts of normal saline with antibiotic solution using pulsatile lavage and then suctioned dry. Polymethylmethacrylate cement with gentamicin was prepared in the usual fashion using a vacuum mixer. Cement was applied to the cut surface of the proximal tibia as well as along the undersurface of a size 3 rotating platform tibial component. Tibial component was positioned and impacted into place. Excess cement was removed using [employer] fast streamer. Cement was then applied to the cut surfaces of the femur as well as along the posterior flanges of the size 3 femoral component. The femoral component was positioned and impacted into place. Excess cement was removed using [employer] fast streamer. A 5 mm polyethylene trial was inserted and the knee was brought into full extension with steady axial compression applied. Finally, cement was applied to the backside of a 32 mm medialized dome patella and the patellar component was positioned and patellar clamp applied. Excess cement was removed using [employer] fast streamer.  After adequate curing of the cement, the tourniquet was deflated after a total tourniquet time of 98 minutes. Hemostasis was achieved using electrocautery. The knee was irrigated with copious amounts of normal saline with antibiotic solution using pulsatile lavage and then suctioned dry. 20 mL of 1.3% Exparel and 60 mL of 0.25% Marcaine in 40 mL of normal saline was injected along the posterior capsule, medial and lateral gutters, and along the arthrotomy site. A 5 mm stabilized rotating platform polyethylene insert was inserted and the knee was placed through a range of motion with excellent mediolateral soft tissue balancing appreciated and excellent patellar tracking noted. 2 medium drains were placed in the wound bed and brought out through separate stab incisions. The medial parapatellar portion of the incision was reapproximated using interrupted sutures of #1 Vicryl. Subcutaneous tissue was approximated in layers using first #0 Vicryl followed #2-0 Vicryl. The skin was approximated with skin staples. A sterile dressing was applied.  The patient tolerated the procedure well and was transported to the recovery room in stable condition.    James P. Holley Bouche., M.D.

## 2019-09-12 NOTE — H&P (Signed)
The patient has been re-examined, and the chart reviewed, and there have been no interval changes to the documented history and physical.    The risks, benefits, and alternatives have been discussed at length. The patient expressed understanding of the risks benefits and agreed with plans for surgical intervention.  Jurney Overacker P. Margie Urbanowicz, Jr. M.D.    

## 2019-09-12 NOTE — Plan of Care (Signed)
Pt alert and oriented. No complaints of pain at this time. No visible skin issues. Dressing and IceMan on pt right knee. Pdowless,rn

## 2019-09-12 NOTE — Anesthesia Postprocedure Evaluation (Signed)
Anesthesia Post Note  Patient: Alexandria [name]  Procedure(s) Performed: COMPUTER ASSISTED TOTAL KNEE ARTHROPLASTY (Right Knee)  Patient location during evaluation: PACU Anesthesia Type: General Level of consciousness: awake and alert Pain management: pain level controlled Vital Signs Assessment: post-procedure vital signs reviewed and stable Respiratory status: spontaneous breathing, nonlabored ventilation, respiratory function stable and patient connected to nasal cannula oxygen Cardiovascular status: blood pressure returned to baseline and stable Postop Assessment: no apparent nausea or vomiting Anesthetic complications: no     Last Vitals:  Vitals:   09/12/19 1827 09/12/19 1832  BP:  123/61  Pulse: 88 93  Resp: 17 (!) 21  Temp:    SpO2: 91% 92%    Last Pain:  Vitals:   09/12/19 1832  TempSrc:   PainSc: 7                  Precious Haws Piscitello

## 2019-09-12 NOTE — Anesthesia Preprocedure Evaluation (Addendum)
Anesthesia Evaluation  Patient identified by MRN, date of birth, ID band Patient awake    Reviewed: Allergy & Precautions, H&P , NPO status , Patient's Chart, lab work & pertinent test results  Airway Mallampati: II  TM Distance: >3 FB Neck ROM: full    Dental  (+) Teeth Intact   Pulmonary neg pulmonary ROS, neg shortness of breath, neg COPD, Not current smoker,           Cardiovascular hypertension, (-) angina(-) Past MI (-) dysrhythmias      Neuro/Psych  Headaches, negative psych ROS   GI/Hepatic negative GI ROS, Neg liver ROS,   Endo/Other  negative endocrine ROS  Renal/GU      Musculoskeletal   Abdominal   Peds  Hematology negative hematology ROS (+)   Anesthesia Other Findings Past Medical History: No date: Arthritis No date: Hypertension No date: Migraines  Past Surgical History: 09/08/2017: KNEE ARTHROSCOPY WITH MEDIAL MENISECTOMY; Left     Comment:  Procedure: KNEE ARTHROSCOPY WITH PARTIAL MEDIAL               MENISECTOMY;  Surgeon: Hessie Knows, MD;  Location: [facility]              ORS;  Service: Orthopedics;  Laterality: Left; 09/08/2017: SYNOVECTOMY; Left     Comment:  Procedure: PartiaL SYNOVECTOMY;  Surgeon: Hessie Knows,              MD;  Location: [facility] ORS;  Service: Orthopedics;                Laterality: Left; 12/06/2018: TOTAL KNEE ARTHROPLASTY; Left     Comment:  Procedure: TOTAL KNEE ARTHROPLASTY;  Surgeon: Dereck Leep, MD;  Location: [facility] ORS;  Service: Orthopedics;               Laterality: Left;     Reproductive/Obstetrics negative OB ROS                            Anesthesia Physical Anesthesia Plan  ASA: II  Anesthesia Plan: General ETT   Post-op Pain Management:    Induction:   PONV Risk Score and Plan: Midazolam, Ondansetron and Dexamethasone  Airway Management Planned: Natural Airway and Simple Face Mask  Additional Equipment:    Intra-op Plan:   Post-operative Plan:   Informed Consent: I have reviewed the patients History and Physical, chart, labs and discussed the procedure including the risks, benefits and alternatives for the proposed anesthesia with the patient or authorized representative who has indicated his/her understanding and acceptance.     Dental Advisory Given  Plan Discussed with: Anesthesiologist  Anesthesia Plan Comments: (Discussed risks and benefits of spinal vs. GETA.  Pt refuses spinal, will proceed with GETA.)       Anesthesia Quick Evaluation

## 2019-09-13 ENCOUNTER — Encounter: Payer: Self-pay | Admitting: Orthopedic Surgery

## 2019-09-13 NOTE — TOC Progression Note (Signed)
Transition of Care [facility]) - Progression Note    Patient Details  Name: Alexandria [name] MRN: [id number] Date of Birth: [birth year]  Transition of Care [facility]) CM/SW [place], RN Phone Number: 09/13/2019, 8:25 AM  Clinical Narrative:    Requested the price of Lovnenox        Expected Discharge Plan and Services                                                 Social Determinants of Health (SDOH) Interventions    Readmission Risk Interventions No flowsheet data found.

## 2019-09-13 NOTE — TOC Benefit Eligibility Note (Signed)
Transition of Care [facility]) Benefit Eligibility Note    Patient Details  Name: Alexandria [name] MRN: [id number] Date of Birth: 1971-08-18   Medication/Dose: Enoxaparin 40 mg daily x 14 days  Covered?: Yes     Prescription Coverage Preferred Pharmacy: [facility] is okay.  [employer] uses [facility] home delivery  Spoke with Person/Company/Phone Number:: Camry Robello, [place], [phone]  Co-Pay: 20 % of out-of-pocket amount (pt. has exceeded her out-of-pocket)  Prior Approval: No     Additional Notes: Lovenox not on formulary    [place] Phone Number: 09/13/2019, 12:08 PM

## 2019-09-13 NOTE — Progress Notes (Signed)
Physical Therapy Treatment Patient Details Name: Alexandria [name] MRN: [id number] DOB: 1971-07-08 Today's Date: 09/13/2019    History of Present Illness Pt. is a 48 y/o female s/p R TKA 12/2. PMHx includes:  L TKA 2/20    PT Comments    Pt continues to do very well with PT and showed no hesitation with ambulation, exercises, etc.  She did not need any physical assist with mobility, transfers, negotiated up/down steps and circumambulated the nurses' station safely and w/o issue.  Pt progressing nicely and expecting to d/c tomorrow.   Follow Up Recommendations  Outpatient PT     Equipment Recommendations       Recommendations for Other Services       Precautions / Restrictions Precautions Precautions: Fall;Knee Precaution Booklet Issued: Yes (comment) Restrictions Weight Bearing Restrictions: Yes RLE Weight Bearing: Weight bearing as tolerated    Mobility  Bed Mobility Overal bed mobility: Independent             General bed mobility comments: Pt easily gets out of and back into bed w/o issue  Transfers Overall transfer level: Independent Equipment used: Rolling walker (2 wheeled)             General transfer comment: Pt did not need cues for appropriate set up or AD/UE use, confident and safe with transitions  Ambulation/Gait Ambulation/Gait assistance: Supervision Gait Distance (Feet): (250) Assistive device: Rolling walker (2 wheeled)       General Gait Details: Pt again able to ambulate with consistent and confident cadence, minimal reliance on the walker, community appropriate speed and overall did well with the effort w/o fatigue or safety issues. Endorses some minimal soreness in knee.   Stairs Stairs: Yes Stairs assistance: Supervision Stair Management: Two rails;Alternating pattern;Forwards Number of Stairs: 4 General stair comments: Pt easily and confidently able to negotiate up/down steps w/o hesitation    Wheelchair Mobility     Modified Rankin (Stroke Patients Only)       Balance Overall balance assessment: Independent                                          Cognition Arousal/Alertness: Awake/alert Behavior During Therapy: WFL for tasks assessed/performed Overall Cognitive Status: Within Functional Limits for tasks assessed                                        Exercises Total Joint Exercises Ankle Circles/Pumps: Strengthening;15 reps Quad Sets: Strengthening;15 reps Short Arc Quad: Strengthening;15 reps Heel Slides: Strengthening;10 reps(with resisted leg extensions) Hip ABduction/ADduction: Strengthening;10 reps Straight Leg Raises: Strengthening;10 reps Knee Flexion: PROM;5 reps    General Comments        Pertinent Vitals/Pain Pain Assessment: 0-10 Pain Score: 3  Pain Location: Right knee Pain Descriptors / Indicators: Aching    Home Living Family/patient expects to be discharged to:: Private residence Living Arrangements: Spouse/significant other Available Help at Discharge: Family;Available 24 hours/day Type of Home: House Home Access: Stairs to enter Entrance Stairs-Rails: Can reach both Home Layout: Able to live on main level with bedroom/bathroom Home Equipment: Walker - 2 wheels;Grab bars - tub/shower;Grab bars - toilet;Hand held shower head      Prior Function Level of Independence: Independent          PT Goals (current goals can  now be found in the care plan section) Acute Rehab PT Goals Patient Stated Goal: To return home Progress towards PT goals: Progressing toward goals    Frequency    BID      PT Plan Current plan remains appropriate    Co-evaluation              AM-PAC PT "6 Clicks" Mobility   Outcome Measure  Help needed turning from your back to your side while in a flat bed without using bedrails?: None Help needed moving from lying on your back to sitting on the side of a flat bed without using  bedrails?: None Help needed moving to and from a bed to a chair (including a wheelchair)?: None Help needed standing up from a chair using your arms (e.g., wheelchair or bedside chair)?: None Help needed to walk in hospital room?: None Help needed climbing 3-5 steps with a railing? : None 6 Click Score: 24    End of Session Equipment Utilized During Treatment: Gait belt Activity Tolerance: Patient tolerated treatment well Patient left: with chair alarm set;with call bell/phone within reach Nurse Communication: Mobility status PT Visit Diagnosis: Muscle weakness (generalized) (M62.81);Difficulty in walking, not elsewhere classified (R26.2);Pain Pain - Right/Left: Right Pain - part of body: Knee     Time: 8921-1941 PT Time Calculation (min) (ACUTE ONLY): 28 min  Charges:  $Gait Training: 8-22 mins $Therapeutic Exercise: 8-22 mins                     Malachi Pro, DPT 09/13/2019, 3:40 PM

## 2019-09-13 NOTE — Evaluation (Signed)
Occupational Therapy Evaluation Patient Details Name: Alexandria [name] MRN: [id number] DOB: Jul 22, 1971 Today's Date: 09/13/2019    History of Present Illness Pt. is a 48 y/o female s/p R TKA 12/2. PMHx includes:  L TKA 2/20   Clinical Impression   Pt. Is a 48 y.o female who has had a Right TKR, and has had a recent Left TKR which she reports that she has had a relatively quick recovery from. Pt. presents with 4/10 right knee pain. Pt. resides at home with her husband, and daughter. Pt. was independent with ADLs, and IADL functioning: including meal preparation, and medication management, driving, and working as a Nurse, children's. No further OT treatment plan, or goals are warranted at this time for OT skilled services.. Pt. Reports requiring no OT related needs following her previous knee surgery, and anticipates no additional needs at this time. Will sign off, and complete the OT order.     Follow Up Recommendations  No OT follow up    Equipment Recommendations       Recommendations for Other Services       Precautions / Restrictions Precautions Precautions: Fall;Knee Restrictions Weight Bearing Restrictions: Yes RLE Weight Bearing: Weight bearing as tolerated      Mobility Bed Mobility Overal bed mobility: Independent                Transfers Overall transfer level: Independent Equipment used: Rolling walker (2 wheeled)                  Balance                                           ADL either performed or assessed with clinical judgement   ADL Overall ADL's : Needs assistance/impaired Eating/Feeding: Independent;Set up   Grooming: Set up;Independent   Upper Body Bathing: Set up;Independent   Lower Body Bathing: Supervison/ safety;Set up   Upper Body Dressing : Set up;Independent   Lower Body Dressing: Set up;Supervision/safety                       Vision Patient Visual Report: No change from baseline        Perception     Praxis      Pertinent Vitals/Pain Pain Assessment: 0-10 Pain Score: 4  Pain Location: Right knee Pain Descriptors / Indicators: Aching     Hand Dominance Right   Extremity/Trunk Assessment Upper Extremity Assessment Upper Extremity Assessment: Overall WFL for tasks assessed           Communication Communication Communication: No difficulties   Cognition Arousal/Alertness: Awake/alert Behavior During Therapy: WFL for tasks assessed/performed Overall Cognitive Status: Within Functional Limits for tasks assessed                                     General Comments       Exercises     Shoulder Instructions      Home Living Family/patient expects to be discharged to:: Private residence Living Arrangements: Spouse/significant other Available Help at Discharge: Family;Available 24 hours/day Type of Home: House Home Access: Stairs to enter   Entrance Stairs-Rails: Can reach both Home Layout: Able to live on main level with bedroom/bathroom     Bathroom Shower/Tub: Tub/shower unit;Door   [employer]  Toilet: Standard     Home Equipment: Walker - 2 wheels;Grab bars - tub/shower;Grab bars - toilet;Hand held shower head          Prior Functioning/Environment Level of Independence: Independent                 OT Problem List:        OT Treatment/Interventions:      OT Goals(Current goals can be found in the care plan section) Acute Rehab OT Goals Patient Stated Goal: To return home OT Goal Formulation: With patient Potential to Achieve Goals: Good  OT Frequency:     Barriers to D/C:            Co-evaluation              AM-PAC OT "6 Clicks" Daily Activity     Outcome Measure Help from another person eating meals?: None Help from another person taking care of personal grooming?: None Help from another person toileting, which includes using toliet, bedpan, or urinal?: A Little Help from another person  bathing (including washing, rinsing, drying)?: A Little Help from another person to put on and taking off regular upper body clothing?: None Help from another person to put on and taking off regular lower body clothing?: A Little 6 Click Score: 21   End of Session    Activity Tolerance: Patient tolerated treatment well Patient left: in chair;with call bell/phone within reach;with chair alarm set                   Time: 8588-5027 OT Time Calculation (min): 14 min Charges:  OT General Charges $OT Visit: 1 Visit OT Evaluation $OT Eval Low Complexity: 1 Low  Olegario Messier, MS, OTR/L  Olegario Messier 09/13/2019, 3:00 PM

## 2019-09-13 NOTE — Progress Notes (Addendum)
  Subjective: 1 Day Post-Op Procedure(s) (LRB): COMPUTER ASSISTED TOTAL KNEE ARTHROPLASTY (Right) Patient reports pain as well-controlled.   Patient is well, and has had no acute complaints or problems Plan is to go Home after hospital stay. Negative for chest pain and shortness of breath Fever: no Gastrointestinal: negative for nausea and vomiting.  Patient has not had a bowel movement.  Objective: Vital signs in last 24 hours: Temp:  [97 F (36.1 C)-99 F (37.2 C)] 97.7 F (36.5 C) (12/03 0751) Pulse Rate:  [64-98] 78 (12/03 0751) Resp:  [14-26] 17 (12/03 0751) BP: (107-156)/(56-83) 131/77 (12/03 0751) SpO2:  [91 %-99 %] 97 % (12/03 0751) Weight:  [85.3 kg] 85.3 kg (12/02 1137)  Intake/Output from previous day:  Intake/Output Summary (Last 24 hours) at 09/13/2019 1127 Last data filed at 09/13/2019 0951 Gross per 24 hour  Intake 2307.85 ml  Output 628 ml  Net 1679.85 ml    Intake/Output this shift: Total I/O In: 240 [P.O.:240] Out: -   Labs: No results for input(s): HGB in the last 72 hours. No results for input(s): WBC, RBC, HCT, PLT in the last 72 hours. No results for input(s): NA, K, CL, CO2, BUN, CREATININE, GLUCOSE, CALCIUM in the last 72 hours. No results for input(s): LABPT, INR in the last 72 hours.   EXAM General - Patient is Alert, Appropriate and Oriented Extremity - Neurovascular intact Dorsiflexion/Plantar flexion intact Compartment soft Dressing/Incision -Postoperative dressing remains in place., Polar Care in place and working. , Hemovac in place. Bone foam resting on side counter.  Motor Function - intact, moving foot and toes well on exam.  Cardiovascular- Regular rate and rhythm, no murmurs/rubs/gallops Respiratory- Lungs clear to auscultation bilaterally Gastrointestinal- soft, nontender and active bowel sounds   Assessment/Plan: 1 Day Post-Op Procedure(s) (LRB): COMPUTER ASSISTED TOTAL KNEE ARTHROPLASTY (Right) Active Problems:   Total  knee replacement status  Estimated body mass index is 33.3 kg/m as calculated from the following:   Height as of this encounter: 5\' 3"  (1.6 m).   Weight as of this encounter: 85.3 kg. Advance diet Up with therapy Plan for discharge tomorrow  Encouraged use of Bone Foam as tolerated. Patient understood.  DVT Prophylaxis - Lovenox, Ted hose and foot pumps Weight-Bearing as tolerated to right leg  Cassell Smiles, PA-C [facility] 09/13/2019, 11:27 AM

## 2019-09-13 NOTE — TOC Initial Note (Signed)
Transition of Care [facility]) - Initial/Assessment Note    Patient Details  Name: Alexandria [name] MRN: [id number] Date of Birth: [birth year]  Transition of Care [facility]) CM/SW Contact:    Su Hilt, RN Phone Number: 09/13/2019, 10:26 AM  Clinical Narrative:                 Met with the patient to discuss DC plan and needs She lives at home with her husband and her 48 year old daughter, she has a rolling walker at home and does not need additional DME Her husband provides transportation She has an outpatient PT appointment for Monday NO additional needs    Expected Discharge Plan: OP Rehab Barriers to Discharge: Barriers Resolved   Patient Goals and CMS Choice Patient states their goals for this hospitalization and ongoing recovery are:: go home      Expected Discharge Plan and Services Expected Discharge Plan: OP Rehab   Discharge Planning Services: CM Consult   Living arrangements for the past 2 months: Single Family Home                 DME Arranged: N/A         HH Arranged: NA          Prior Living Arrangements/Services Living arrangements for the past 2 months: Single Family Home Lives with:: Spouse, Minor Children Patient language and need for interpreter reviewed:: Yes Do you feel safe going back to the place where you live?: Yes      Need for Family Participation in Patient Care: No (Comment) Care giver support system in place?: Yes (comment) Current home services: DME(Rolling walker) Criminal Activity/Legal Involvement Pertinent to Current Situation/Hospitalization: No - Comment as needed  Activities of Daily Living Home Assistive Devices/Equipment: Gilford Rile (specify type) ADL Screening (condition at time of admission) Patient's cognitive ability adequate to safely complete daily activities?: Yes Is the patient deaf or have difficulty hearing?: No Does the patient have difficulty seeing, even when wearing glasses/contacts?: No Does the patient have  difficulty concentrating, remembering, or making decisions?: No Patient able to express need for assistance with ADLs?: No Does the patient have difficulty dressing or bathing?: No Independently performs ADLs?: Yes (appropriate for developmental age) Does the patient have difficulty walking or climbing stairs?: Yes Weakness of Legs: Right Weakness of Arms/Hands: None  Permission Sought/Granted   Permission granted to share information with : Yes, Verbal Permission Granted              Emotional Assessment Appearance:: Appears stated age Attitude/Demeanor/Rapport: Engaged Affect (typically observed): Appropriate Orientation: : Oriented to Self, Oriented to Place, Oriented to  Time, Oriented to Situation Alcohol / Substance Use: Not Applicable Psych Involvement: No (comment)  Admission diagnosis:  PRIMARY OSTEOARTHRITIS OF RIGHT KNEE. Patient Active Problem List   Diagnosis Date Noted  . Chronic fatigue 07/14/2019  . 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 Arthralgia 12/16/2017  . Vitamin D deficiency 12/16/2017  . Left ankle pain 12/16/2017  . Essential hypertension 05/25/2016  . Migraine 05/25/2016  . Allergic rhinitis 05/25/2016  . Oral contraceptive use 05/25/2016  . Osteoarthritis of right knee 05/25/2016   PCP:  Leone Haven, MD Pharmacy:   [facility] [place] [address] Phone: [phone] Fax: [phone]     Social Determinants of Health (SDOH) Interventions    Readmission Risk Interventions No flowsheet data found.

## 2019-09-13 NOTE — Evaluation (Signed)
Physical Therapy Evaluation Patient Details Name: Alexandria [name] MRN: [id number] DOB: 24-May-1971 Today's Date: 09/13/2019   History of Present Illness  48 y/o female s/p R TKA 12/2, had L TKA 2/20 with quick recovery  Clinical Impression  Pt did well    Follow Up Recommendations Follow surgeon's recommendation for DC plan and follow-up therapies(HHPT vs outpt PT)    Equipment Recommendations  None recommended by PT    Recommendations for Other Services       Precautions / Restrictions Precautions Precautions: Fall;Knee Restrictions Weight Bearing Restrictions: Yes RLE Weight Bearing: Weight bearing as tolerated      Mobility  Bed Mobility Overal bed mobility: Independent             General bed mobility comments: easily able to rise to EOB w/o assist  Transfers Overall transfer level: Independent Equipment used: Rolling walker (2 wheeled)             General transfer comment: Pt able to rise w/o hesitation, showed good confidence and safety  Ambulation/Gait Ambulation/Gait assistance: Supervision Gait Distance (Feet): 200 Feet Assistive device: Rolling walker (2 wheeled)       General Gait Details: Pt able to ambulate with good speed and confidence.  Not overly reliant on the walker, good confidence and no safety issues.    Stairs            Wheelchair Mobility    Modified Rankin (Stroke Patients Only)       Balance Overall balance assessment: Modified Independent                                           Pertinent Vitals/Pain Pain Assessment: 0-10 Pain Score: 4     Home Living Family/patient expects to be discharged to:: Private residence Living Arrangements: Spouse/significant other Available Help at Discharge: Family;Available 24 hours/day Type of Home: House Home Access: Stairs to enter Entrance Stairs-Rails: Can reach both Entrance Stairs-Number of Steps: 4 Home Layout: Able to live on main level with  bedroom/bathroom Home Equipment: Walker - 2 wheels;Grab bars - tub/shower;Grab bars - toilet      Prior Function Level of Independence: Independent               Hand Dominance        Extremity/Trunk Assessment   Upper Extremity Assessment Upper Extremity Assessment: Overall WFL for tasks assessed    Lower Extremity Assessment Lower Extremity Assessment: Overall WFL for tasks assessed       Communication   Communication: No difficulties  Cognition Arousal/Alertness: Awake/alert   Overall Cognitive Status: Within Functional Limits for tasks assessed                                        General Comments      Exercises Total Joint Exercises Ankle Circles/Pumps: Strengthening;10 reps Quad Sets: Strengthening;10 reps Short Arc Quad: AROM Heel Slides: 10 reps Hip ABduction/ADduction: Strengthening;10 reps Straight Leg Raises: AROM;10 reps Knee Flexion: PROM;5 reps Goniometric ROM: 0-88 AROM, 101 PROM   Assessment/Plan    PT Assessment Patient needs continued PT services  PT Problem List Decreased strength;Decreased range of motion;Pain;Decreased knowledge of use of DME;Decreased safety awareness;Decreased activity tolerance;Decreased balance       PT Treatment Interventions DME instruction;Gait training;Stair training;Functional mobility  training;Therapeutic activities;Therapeutic exercise;Balance training;Patient/family education    PT Goals (Current goals can be found in the Care Plan section)  Acute Rehab PT Goals Patient Stated Goal: go home tomorrow PT Goal Formulation: With patient Time For Goal Achievement: 09/27/19 Potential to Achieve Goals: Good    Frequency BID   Barriers to discharge        Co-evaluation               AM-PAC PT "6 Clicks" Mobility  Outcome Measure Help needed turning from your back to your side while in a flat bed without using bedrails?: None Help needed moving from lying on your back to  sitting on the side of a flat bed without using bedrails?: None Help needed moving to and from a bed to a chair (including a wheelchair)?: None Help needed standing up from a chair using your arms (e.g., wheelchair or bedside chair)?: None Help needed to walk in hospital room?: None Help needed climbing 3-5 steps with a railing? : A Little 6 Click Score: 23    End of Session Equipment Utilized During Treatment: Gait belt Activity Tolerance: Patient tolerated treatment well Patient left: with chair alarm set;with call bell/phone within reach Nurse Communication: Mobility status PT Visit Diagnosis: Muscle weakness (generalized) (M62.81);Difficulty in walking, not elsewhere classified (R26.2);Pain Pain - Right/Left: Right Pain - part of body: Knee    Time: 3825-0539 PT Time Calculation (min) (ACUTE ONLY): 37 min   Charges:   PT Evaluation $PT Eval Low Complexity: 1 Low PT Treatments $Gait Training: 8-22 mins $Therapeutic Exercise: 8-22 mins        Kreg Shropshire, DPT 09/13/2019, 11:22 AM

## 2019-09-14 ENCOUNTER — Encounter: Payer: Self-pay | Admitting: Orthopedic Surgery

## 2019-09-14 MED ORDER — ENOXAPARIN SODIUM 40 MG/0.4ML ~~LOC~~ SOLN: 40.0000 mg | SUBCUTANEOUS | 0 refills | Status: AC

## 2019-09-14 MED ORDER — OXYCODONE HCL 5 MG PO TABS: 5.0000 mg | ORAL_TABLET | ORAL | 0 refills | Status: AC | PRN

## 2019-09-14 MED ORDER — TRAMADOL HCL 50 MG PO TABS: 50.0000 mg | ORAL_TABLET | ORAL | 0 refills | Status: AC | PRN

## 2019-09-14 NOTE — Progress Notes (Signed)
Physical Therapy Treatment Patient Details Name: Alexandria [name] MRN: [id number] DOB: 12/16/1970 Today's Date: 09/14/2019    History of Present Illness Pt. is a 48 y/o female s/p R TKA 12/2. PMHx includes:  L TKA 2/20    PT Comments    Pt is able to ambulate safely, confidently and w/o issue.  She showed good quad control and tolerated resistance with all bed mobility acts.  Pt with >90 of AROM flexion and with warm up reps and PROM overpressure achieved 110 deg w/o excessive pain.  Pt safe and ready to go home, no further acute PT needs - to continue with outpt PT on discharge.   Follow Up Recommendations  Outpatient PT     Equipment Recommendations  None recommended by PT    Recommendations for Other Services       Precautions / Restrictions Precautions Precautions: Fall;Knee Restrictions RLE Weight Bearing: Weight bearing as tolerated    Mobility  Bed Mobility Overal bed mobility: Independent             General bed mobility comments: Pt easily gets out of and back into bed w/o issue  Transfers Overall transfer level: Independent Equipment used: Rolling walker (2 wheeled)             General transfer comment: Pt did not need cues for appropriate set up or AD/UE use, confident and safe with transitions  Ambulation/Gait Ambulation/Gait assistance: Supervision Gait Distance (Feet): 350 Feet Assistive device: Rolling walker (2 wheeled)       General Gait Details: Great speed and confidence with ambulation, no hesitation, limited reliance on the walker and overall no safety of other issues.   Stairs             Wheelchair Mobility    Modified Rankin (Stroke Patients Only)       Balance Overall balance assessment: Independent                                          Cognition Arousal/Alertness: Awake/alert Behavior During Therapy: WFL for tasks assessed/performed Overall Cognitive Status: Within Functional Limits for  tasks assessed                                        Exercises Total Joint Exercises Quad Sets: Strengthening;15 reps Short Arc Quad: Strengthening;15 reps Heel Slides: Strengthening;10 reps(with resisted leg extensions) Hip ABduction/ADduction: Strengthening;15 reps Straight Leg Raises: Strengthening;15 reps(light resistance on the top) Knee Flexion: PROM;5 reps Goniometric ROM: 0-110    General Comments        Pertinent Vitals/Pain Pain Score: 5     Home Living                      Prior Function            PT Goals (current goals can now be found in the care plan section) Progress towards PT goals: Progressing toward goals    Frequency    BID      PT Plan Current plan remains appropriate    Co-evaluation              AM-PAC PT "6 Clicks" Mobility   Outcome Measure  Help needed turning from your back to your side while in a flat bed without  using bedrails?: None Help needed moving from lying on your back to sitting on the side of a flat bed without using bedrails?: None Help needed moving to and from a bed to a chair (including a wheelchair)?: None Help needed standing up from a chair using your arms (e.g., wheelchair or bedside chair)?: None Help needed to walk in hospital room?: None Help needed climbing 3-5 steps with a railing? : None 6 Click Score: 24    End of Session Equipment Utilized During Treatment: Gait belt Activity Tolerance: Patient tolerated treatment well Patient left: with chair alarm set;with call bell/phone within reach Nurse Communication: Mobility status PT Visit Diagnosis: Muscle weakness (generalized) (M62.81);Difficulty in walking, not elsewhere classified (R26.2);Pain Pain - Right/Left: Right Pain - part of body: Knee     Time: 1884-1660 PT Time Calculation (min) (ACUTE ONLY): 28 min  Charges:  $Gait Training: 8-22 mins $Therapeutic Exercise: 8-22 mins                     Kreg Shropshire, DPT 09/14/2019, 10:27 AM

## 2019-09-14 NOTE — TOC Transition Note (Signed)
Transition of Care [facility]) - CM/SW Discharge Note   Patient Details  Name: Alexandria [name] MRN: [id number] Date of Birth: Mar 16, 1971  Transition of Care [facility]) CM/SW Contact:  Su Hilt, RN Phone Number: 09/14/2019, 8:50 AM   Clinical Narrative:    Patient to DC home today and go to Outpatient PT that is already set up, she has the DME she needs at home, she will go home with her family, no additional needs   Final next level of care: OP Rehab Barriers to Discharge: Barriers Resolved   Patient Goals and CMS Choice Patient states their goals for this hospitalization and ongoing recovery are:: go home      Discharge Placement                       Discharge Plan and Services   Discharge Planning Services: CM Consult            DME Arranged: N/A         HH Arranged: NA          Social Determinants of Health (SDOH) Interventions     Readmission Risk Interventions No flowsheet data found.

## 2019-09-14 NOTE — Discharge Summary (Signed)
Physician Discharge Summary  Patient ID: Alexandria [name] MRN: [id number] DOB/AGE: 11-07-1970 48 y.o.  Admit date: 09/12/2019 Discharge date: 09/14/2019  Admission Diagnoses:  PRIMARY OSTEOARTHRITIS OF RIGHT KNEE.  Surgeries:Procedure(s):  Right total knee arthroplasty using computer-assisted navigation  SURGEON:  Jena Gauss. M.D.  ASSISTANT: Baldwin Jamaica, PA-C (present and scrubbed throughout the case, critical for assistance with exposure, retraction, instrumentation, and closure)  ANESTHESIA: general  ESTIMATED BLOOD LOSS: 50 mL  FLUIDS REPLACED: 1300 mL of crystalloid  TOURNIQUET TIME: 98 minutes  DRAINS: 2 medium Hemovac drains  SOFT TISSUE RELEASES: Anterior cruciate ligament, posterior cruciate ligament, deep and superficial medial collateral ligament, patellofemoral ligament  IMPLANTS UTILIZED: DePuy Attune size 3 posterior stabilized femoral component (cemented), size 3 rotating platform tibial component (cemented), 32 mm medialized dome patella (cemented), and a 5 mm stabilized rotating platform polyethylene insert.  Discharge Diagnoses: Patient Active Problem List   Diagnosis Date Noted  . Chronic fatigue 07/14/2019  . 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 Arthralgia 12/16/2017  . Vitamin D deficiency 12/16/2017  . Left ankle pain 12/16/2017  . Essential hypertension 05/25/2016  . Migraine 05/25/2016  . Allergic rhinitis 05/25/2016  . Oral contraceptive use 05/25/2016  . Osteoarthritis of right knee 05/25/2016    Past Medical History:  Diagnosis Date  . Arthritis   . Hypertension   . Migraines      Transfusion:    Consultants (if any):   Discharged Condition: Improved  Hospital Course: NIAH HEINLE is an 48 y.o. female who was admitted 09/12/2019 with a diagnosis of right knee osteoarthritis and went to the operating room on 09/12/2019 and underwent right total knee arthroplasty. The patient received perioperative antibiotics for  prophylaxis (see below). The patient tolerated the procedure well and was transported to PACU in stable condition. After meeting PACU criteria, the patient was subsequently transferred to the Orthopaedics/Rehabilitation unit.   The patient received DVT prophylaxis in the form of early mobilization, Lovenox, Foot Pumps and TED hose. A sacral pad had been placed and heels were elevated off of the bed with rolled towels in order to protect skin integrity. Foley catheter was discontinued on postoperative day #1. Wound drains were discontinued on postoperative day #2. The surgical incision was healing well without signs of infection.  Physical therapy was initiated postoperatively for transfers, gait training, and strengthening. Occupational therapy was initiated for activities of daily living and evaluation for assisted devices. Rehabilitation goals were reviewed in detail with the patient. The patient made steady progress with physical therapy and physical therapy recommended discharge to Home.   The patient achieved her preliminary goals of this hospitalization and was felt to be medically and orthopaedically appropriate for discharge.  She was given perioperative antibiotics:  Anti-infectives (From admission, onward)   Start     Dose/Rate Route Frequency Ordered Stop   09/12/19 2326  clindamycin (CLEOCIN) IVPB 600 mg     600 mg 100 mL/hr over 30 Minutes Intravenous Every 6 hours 09/12/19 2326 09/14/19 0029   09/12/19 1200  vancomycin (VANCOCIN) IVPB 1000 mg/200 mL premix  Status:  Discontinued     1,000 mg 200 mL/hr over 60 Minutes Intravenous  Once 09/12/19 0948 09/12/19 2118   09/12/19 1113  ceFAZolin (ANCEF) 2-4 GM/100ML-% IVPB    Note to Pharmacy: Register, Karen   : cabinet override      09/12/19 1113 09/12/19 1415   09/12/19 1000  ceFAZolin (ANCEF) IVPB 2g/100 mL premix     2 g  200 mL/hr over 30 Minutes Intravenous On call to O.R. 09/12/19 0948 09/12/19 1415   09/12/19 0545  vancomycin  (VANCOCIN) IVPB 1000 mg/200 mL premix     1,000 mg 200 mL/hr over 60 Minutes Intravenous  Once 09/12/19 0534 09/12/19 1400    .  Recent vital signs:  Vitals:   09/13/19 1536 09/14/19 0014  BP: 135/74 (!) 147/83  Pulse: 87 94  Resp: 17 20  Temp: 98.6 F (37 C) 99 F (37.2 C)  SpO2: 98% 96%    Recent laboratory studies:  No results for input(s): WBC, HGB, HCT, PLT, K, CL, CO2, BUN, CREATININE, GLUCOSE, CALCIUM, LABPT, INR in the last 72 hours.  Diagnostic Studies: Dg Knee Right Port  Result Date: 09/12/2019 CLINICAL DATA:  Postop knee arthroplasty. EXAM: PORTABLE RIGHT KNEE - 1-2 VIEW COMPARISON:  None. FINDINGS: Right knee total arthroplasty in expected alignment. No periprosthetic lucency or fracture. There is been patellar resurfacing. Ghost tracks in the distal femur and proximal tibia. Recent postsurgical change includes air and edema in the soft tissues and joint, anterior skin staples, and drain in the suprapatellar region. IMPRESSION: Post right knee arthroplasty without immediate postoperative complication. Electronically Signed   By: Narda RutherfordMelanie  Sanford M.D.   On: 09/12/2019 18:17    Discharge Medications:   Allergies as of 09/14/2019      Reactions   Sulfa Antibiotics Swelling, Other (See Comments), Shortness Of Breath   Almost swelled throat shut Swelling of throat   Penicillins Other (See Comments), Hives   Did it involve swelling of the face/tongue/throat, SOB, or low BP? Unknown Did it involve sudden or severe rash/hives, skin peeling, or any reaction on the inside of your mouth or nose? Unknown Did you need to seek medical attention at a hospital or doctor's office? Yes When did it last happen? Childhood reaction  If all above answers are "NO", may proceed with cephalosporin use. Did it involve swelling of the face/tongue/throat, SOB, or low BP? Unknown Did it involve sudden or severe rash/hives, skin peeling, or any reaction on the inside of your mouth or nose?  Unknown Did you need to seek medical attention at a hospital or doctor's office? Yes When did it last happen? Childhood reaction  If all above answers are "NO", may proceed with cephalosporin use.   Darvon [propoxyphene] Nausea And Vomiting   Darvocet   Doxycycline Nausea And Vomiting   Macrobid [nitrofurantoin Macrocrystal] Nausea And Vomiting      Medication List    TAKE these medications   amLODipine 10 MG tablet Commonly known as: NORVASC Take 1 tablet (10 mg total) by mouth daily.   Camrese 0.15-0.03 &0.01 MG tablet Generic drug: Levonorgestrel-Ethinyl Estradiol Take 1 tablet by mouth at bedtime.   celecoxib 200 MG capsule Commonly known as: CELEBREX Take 200 mg by mouth 2 (two) times daily.   cetirizine 10 MG tablet Commonly known as: ZYRTEC Take 1 tablet (10 mg total) by mouth daily. What changed: when to take this   enoxaparin 40 MG/0.4ML injection Commonly known as: LOVENOX Inject 0.4 mLs (40 mg total) into the skin daily for 14 days.   Fluocinolone Acetonide 0.01 % Oil Place 2 drops in ear(s) 2 (two) times daily. What changed:   when to take this  reasons to take this   norethindrone 0.35 MG tablet Commonly known as: Ortho Micronor Take 1 tablet (0.35 mg total) by mouth daily.   oxyCODONE 5 MG immediate release tablet Commonly known as: Oxy IR/ROXICODONE Take 1  tablet (5 mg total) by mouth every 4 (four) hours as needed for moderate pain (pain score 4-6).   rizatriptan 10 MG tablet Commonly known as: MAXALT Take 10 mg by mouth every 2 (two) hours as needed for migraine. May repeat in 2 hours if needed   SUMAtriptan 50 MG tablet Commonly known as: IMITREX TAKE 1 TABLET BY MOUTH AS NEEDED FOR HEADACHE, MAY REPEAT IN 2 HOURS IF HEADACHE PERSISTS OR RECURS. What changed:   how much to take  how to take this  when to take this  reasons to take this  additional instructions   traMADol 50 MG tablet Commonly known as: ULTRAM Take 1 tablet  (50 mg total) by mouth every 4 (four) hours as needed for moderate pain. What changed:   when to take this  reasons to take this            Durable Medical Equipment  (From admission, onward)         Start     Ordered   09/12/19 2327  DME Walker rolling  Once    Question:  Patient needs a walker to treat with the following condition  Answer:  Total knee replacement status   09/12/19 2326   09/12/19 2327  DME Bedside commode  Once    Question:  Patient needs a bedside commode to treat with the following condition  Answer:  Total knee replacement status   09/12/19 2326          Disposition: Home with outpatient physical therapy    Follow-up Information    Fausto Skillern, PA-C On 09/27/2019.   Specialty: Orthopedic Surgery Why: at 9:15am Contact information: [place] [address] [phone]        Dereck Leep, MD On 10/25/2019.   Specialty: Orthopedic Surgery Why: at 9:45am Contact information: [place] [address] [place], PA-C 09/14/2019, 7:52 AM

## 2019-09-14 NOTE — Progress Notes (Signed)
  Subjective: 2 Days Post-Op Procedure(s) (LRB): COMPUTER ASSISTED TOTAL KNEE ARTHROPLASTY (Right) Patient reports pain as moderate.   Patient is well, and has had no acute complaints or problems Plan is to go Home after hospital stay. Negative for chest pain and shortness of breath Fever: no Gastrointestinal: negative for nausea and vomiting.  Patient has had a bowel movement.  Objective: Vital signs in last 24 hours: Temp:  [97.7 F (36.5 C)-99 F (37.2 C)] 99 F (37.2 C) (12/04 0014) Pulse Rate:  [78-94] 94 (12/04 0014) Resp:  [17-20] 20 (12/04 0014) BP: (122-147)/(70-83) 147/83 (12/04 0014) SpO2:  [96 %-98 %] 96 % (12/04 0014)  Intake/Output from previous day:  Intake/Output Summary (Last 24 hours) at 09/14/2019 0745 Last data filed at 09/14/2019 0630 Gross per 24 hour  Intake 1209.15 ml  Output 725 ml  Net 484.15 ml    Intake/Output this shift: No intake/output data recorded.  Labs: No results for input(s): HGB in the last 72 hours. No results for input(s): WBC, RBC, HCT, PLT in the last 72 hours. No results for input(s): NA, K, CL, CO2, BUN, CREATININE, GLUCOSE, CALCIUM in the last 72 hours. No results for input(s): LABPT, INR in the last 72 hours.   EXAM General - Patient is Alert, Appropriate and Oriented Extremity - Neurovascular intact Dorsiflexion/Plantar flexion intact Compartment soft. Bone Foam on patient.  Dressing/Incision -Postoperative dressing remains in place., Polar Care in place and working. , Hemovac in place.  Motor Function - intact, moving foot and toes well on exam.  Cardiovascular- Regular rate and rhythm, no murmurs/rubs/gallops Respiratory- Lungs clear to auscultation bilaterally Gastrointestinal- soft, nontender and active bowel sounds   Assessment/Plan: 2 Days Post-Op Procedure(s) (LRB): COMPUTER ASSISTED TOTAL KNEE ARTHROPLASTY (Right) Active Problems:   Total knee replacement status  Estimated body mass index is 33.3 kg/m as  calculated from the following:   Height as of this encounter: 5\' 3"  (1.6 m).   Weight as of this encounter: 85.3 kg. Advance diet Up with therapy Discharge home with plans to begin outpatient PT. Hemovac removed.    DVT Prophylaxis - Lovenox, Ted hose and foot pumps Weight-Bearing as tolerated to right leg  Cassell Smiles, PA-C [facility] Orthopa[facility] 09/14/2019, 7:45 AM

## 2020-01-03 ENCOUNTER — Other Ambulatory Visit: Payer: Self-pay | Admitting: Family Medicine

## 2020-01-03 DIAGNOSIS — I10 Essential (primary) hypertension: Secondary | ICD-10-CM

## 2020-01-14 ENCOUNTER — Ambulatory Visit: Payer: Managed Care, Other (non HMO) | Admitting: Family Medicine

## 2020-01-28 ENCOUNTER — Telehealth: Payer: Self-pay | Admitting: Family Medicine

## 2020-01-28 NOTE — Telephone Encounter (Signed)
I called and spoke with the patient and informed her that the provider had no openings until Friday and she asked if he would call in medications for her because she knows what she has, I informed her that the provider would not call in medication without a visit, I offered her an appointment with a different provider and she stated she had to work and could not get off, she blurted out that she would just find her a new primary care provider and hung up.  Alexandria [name],cma

## 2020-01-28 NOTE — Telephone Encounter (Signed)
Pt is following up and said she is in a lot of pain. I suggested she go to an Urgent care b/c she would probably need to be seen. She said she doesn't have time for an urgent care and wants a call back.

## 2020-01-28 NOTE — Telephone Encounter (Signed)
Patient thinks she has a UTI, no available appointments today. Patient would like something called in.

## 2020-06-26 ENCOUNTER — Other Ambulatory Visit: Payer: Self-pay | Admitting: Family Medicine

## 2020-06-26 DIAGNOSIS — I10 Essential (primary) hypertension: Secondary | ICD-10-CM

## 2020-07-17 ENCOUNTER — Encounter: Payer: Self-pay | Admitting: Physician Assistant

## 2020-07-17 ENCOUNTER — Ambulatory Visit: Payer: Managed Care, Other (non HMO) | Admitting: Physician Assistant

## 2020-07-17 ENCOUNTER — Other Ambulatory Visit: Payer: Self-pay

## 2020-07-17 VITALS — BP 140/88 | HR 92 | Temp 97.9°F | Resp 16 | Ht 62.0 in | Wt 199.0 lb

## 2020-07-17 DIAGNOSIS — Z0000 Encounter for general adult medical examination without abnormal findings: Secondary | ICD-10-CM | POA: Diagnosis not present

## 2020-07-17 DIAGNOSIS — Z008 Encounter for other general examination: Secondary | ICD-10-CM | POA: Diagnosis not present

## 2020-07-17 NOTE — Progress Notes (Signed)
   Subjective:    Patient ID: Alexandria [name], female    DOB: Sep 07, 1971, 49 y.o.   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 Tries to walk intermittently during day Had both knees replaced ( Feb 20 /Dec 20) for severe arthritis + family hx Doing very well, active without pain - doesn't kneel Weight crept up during pandemic, focused on it now  Daughter Jon Gills 14 1/2 approaching "teens" Generally do well  HTN- Norvasc BCPs- levonorgestrel Seasonal allergies- Ceterizine Migraines- substancially reduced Celebrex- available but rarely uses since surgery Flucinolone Acetonide - 2 qtts /ear for dermatitis as needed  Review of Systems As noted above  Has had Covid 1 & 2 vaccines- will get Flu as available  Transitioning PCP to Dr. Laural Benes at [facility] next  week- remainder of family already associated with this  practice    Objective:   Physical Exam Vitals and nursing note reviewed.  Constitutional:      General: She is not in acute distress.    Appearance: Normal appearance.     Comments: BMI 36  HENT:     Head: Normocephalic and atraumatic.     Right Ear: Tympanic membrane, ear canal and external ear normal.     Left Ear: Tympanic membrane, ear canal and external ear normal.     Nose: Nose normal.     Mouth/Throat:     Mouth: Mucous membranes are moist.     Comments: DDS q 6 months Eyes:     Extraocular Movements: Extraocular movements intact.     Conjunctiva/sclera: Conjunctivae normal.  Cardiovascular:     Rate and Rhythm: Normal rate and regular rhythm.     Pulses: Normal pulses.     Heart sounds: Normal heart sounds.  Pulmonary:     Effort: Pulmonary effort is normal.     Breath sounds: Normal breath sounds.  Abdominal:     General: Bowel sounds are normal.     Palpations: Abdomen is soft.  Genitourinary:    Comments: Defer to Gyn-denies concern BCPs Musculoskeletal:        General: Normal  range of motion.     Cervical back: Normal range of motion and neck supple.  Lymphadenopathy:     Cervical: No cervical adenopathy.  Skin:    General: Skin is warm and dry.     Capillary Refill: Capillary refill takes less than 2 seconds.  Neurological:     General: No focal deficit present.     Mental Status: She is alert.     Cranial Nerves: No cranial nerve deficit.     Deep Tendon Reflexes: Reflexes normal.  Psychiatric:        Mood and Affect: Mood normal.        Behavior: Behavior normal.       Assessment & Plan:  Doing very well s/p bilateral knees-  Addressing weight loss and increasing exercise as able Will see new PCP in next few weeks Labs to be reported as available Pt states MyChart is active

## 2020-07-18 LAB — LIPID PANEL
Chol/HDL Ratio: 6.5 ratio — ABNORMAL HIGH (ref 0.0–4.4)
Cholesterol, Total: 253 mg/dL — ABNORMAL HIGH (ref 100–199)
HDL: 39 mg/dL — ABNORMAL LOW (ref >39)
LDL Chol Calc (NIH): 185 mg/dL — ABNORMAL HIGH (ref 0–99)
Triglycerides: 154 mg/dL — ABNORMAL HIGH (ref 0–149)
VLDL Cholesterol Cal: 29 mg/dL (ref 5–40)

## 2020-07-18 LAB — GLUCOSE, RANDOM: Glucose: 109 mg/dL — ABNORMAL HIGH (ref 65–99)

## 2020-07-22 ENCOUNTER — Other Ambulatory Visit: Payer: Self-pay | Admitting: Internal Medicine

## 2020-07-22 DIAGNOSIS — Z1231 Encounter for screening mammogram for malignant neoplasm of breast: Secondary | ICD-10-CM

## 2020-08-18 ENCOUNTER — Ambulatory Visit
Admission: RE | Admit: 2020-08-18 | Discharge: 2020-08-18 | Disposition: A | Discharge status: Home / Self Care | Payer: Managed Care, Other (non HMO) | Source: Ambulatory Visit | Attending: Internal Medicine | Admitting: Internal Medicine

## 2020-08-18 ENCOUNTER — Other Ambulatory Visit: Payer: Self-pay

## 2020-08-18 DIAGNOSIS — Z1231 Encounter for screening mammogram for malignant neoplasm of breast: Secondary | ICD-10-CM | POA: Insufficient documentation

## 2023-11-30 ENCOUNTER — Other Ambulatory Visit: Payer: Self-pay | Admitting: Family Medicine

## 2023-11-30 DIAGNOSIS — M5416 Radiculopathy, lumbar region: Secondary | ICD-10-CM

## 2023-12-07 ENCOUNTER — Ambulatory Visit
Admission: RE | Admit: 2023-12-07 | Discharge: 2023-12-07 | Disposition: A | Discharge status: Home / Self Care | Payer: Managed Care, Other (non HMO) | Source: Ambulatory Visit | Attending: Family Medicine | Admitting: Family Medicine

## 2023-12-07 DIAGNOSIS — M5416 Radiculopathy, lumbar region: Secondary | ICD-10-CM

## 2024-01-13 ENCOUNTER — Other Ambulatory Visit: Payer: Self-pay | Admitting: Internal Medicine

## 2024-01-13 DIAGNOSIS — Z1231 Encounter for screening mammogram for malignant neoplasm of breast: Secondary | ICD-10-CM

## 2024-02-03 LAB — COLOGUARD
